# Patient Record
Sex: Male | Born: 1985 | Race: White | Hispanic: No | Marital: Single | Smoking: Current every day smoker
Health system: Southern US, Community
[De-identification: ages and names within clinical notes are randomized; demographics above are authoritative.]

## PROBLEM LIST (undated history)

## (undated) DIAGNOSIS — R569 Unspecified convulsions: Secondary | ICD-10-CM

## (undated) HISTORY — PX: TYMPANOSTOMY TUBE PLACEMENT: SHX32

---

## 2013-12-21 ENCOUNTER — Encounter (HOSPITAL_COMMUNITY): Payer: Self-pay | Admitting: Emergency Medicine

## 2013-12-21 ENCOUNTER — Emergency Department (HOSPITAL_COMMUNITY): Payer: BC Managed Care – PPO

## 2013-12-21 ENCOUNTER — Emergency Department (HOSPITAL_COMMUNITY)
Admission: EM | Admit: 2013-12-21 | Discharge: 2013-12-21 | Disposition: A | Discharge status: Home / Self Care | Payer: BC Managed Care – PPO | Attending: Emergency Medicine | Admitting: Emergency Medicine

## 2013-12-21 DIAGNOSIS — S8263XA Displaced fracture of lateral malleolus of unspecified fibula, initial encounter for closed fracture: Secondary | ICD-10-CM | POA: Diagnosis not present

## 2013-12-21 DIAGNOSIS — Y9289 Other specified places as the place of occurrence of the external cause: Secondary | ICD-10-CM | POA: Diagnosis not present

## 2013-12-21 DIAGNOSIS — S99919A Unspecified injury of unspecified ankle, initial encounter: Secondary | ICD-10-CM

## 2013-12-21 DIAGNOSIS — Y990 Civilian activity done for income or pay: Secondary | ICD-10-CM | POA: Diagnosis not present

## 2013-12-21 DIAGNOSIS — F17200 Nicotine dependence, unspecified, uncomplicated: Secondary | ICD-10-CM | POA: Insufficient documentation

## 2013-12-21 DIAGNOSIS — S8990XA Unspecified injury of unspecified lower leg, initial encounter: Secondary | ICD-10-CM | POA: Diagnosis present

## 2013-12-21 DIAGNOSIS — S82891A Other fracture of right lower leg, initial encounter for closed fracture: Secondary | ICD-10-CM

## 2013-12-21 DIAGNOSIS — Z79899 Other long term (current) drug therapy: Secondary | ICD-10-CM | POA: Insufficient documentation

## 2013-12-21 DIAGNOSIS — S99929A Unspecified injury of unspecified foot, initial encounter: Secondary | ICD-10-CM

## 2013-12-21 DIAGNOSIS — X500XXA Overexertion from strenuous movement or load, initial encounter: Secondary | ICD-10-CM | POA: Diagnosis not present

## 2013-12-21 MED ORDER — IBUPROFEN 200 MG PO TABS
600.0000 mg | ORAL_TABLET | Freq: Once | ORAL | Status: AC
  Administered 2013-12-21: 600 mg via ORAL
  Filled 2013-12-21: qty 3

## 2013-12-21 MED ORDER — HYDROCODONE-ACETAMINOPHEN 5-325 MG PO TABS: 1.0000 | ORAL_TABLET | Freq: Four times a day (QID) | ORAL | Status: DC | PRN

## 2013-12-21 MED ORDER — IBUPROFEN 600 MG PO TABS: 600.0000 mg | ORAL_TABLET | Freq: Once | ORAL | Status: DC

## 2013-12-21 NOTE — ED Provider Notes (Signed)
CSN: [id number]     Arrival date & time 12/21/13  2100 History   None   This chart was scribed for non-physician practitioner [name], PA-C working with [name], MD, by Andrew Au, ED Scribe. This patient was seen in room WTR8/WTR8 and the patient's care was started at 9:46 PM. Chief Complaint  Patient presents with  . Ankle Pain   The history is provided by the patient. No language interpreter was used.   Kevin Roth is a 28 y.o. male who presents to the Emergency Department complaining of right ankle pain onset 8 hours ago. Pt states he twisted his ankle inward when stepping off a curb. Pt reports he's been on his feet all day while at work and did not start to feel the pain until he took his shoe off. Pt reports worsening throbbing pain and pain with bearing weight. He denies taking pain medication. Pt reports h/o broken left ankle.   History reviewed. No pertinent past medical history. Past Surgical History  Procedure Laterality Date  . Tympanostomy tube placement     No family history on file. History  Substance Use Topics  . Smoking status: Current Every Day Smoker -- 1.00 packs/day for 10 years    Types: Cigarettes  . Smokeless tobacco: Never Used  . Alcohol Use: Yes     Comment: Socially    Review of Systems  Musculoskeletal: Positive for arthralgias, gait problem and myalgias.  Skin: Negative for wound.   Allergies  Review of patient's allergies indicates no known allergies.  Home Medications   Prior to Admission medications   Medication Sig Start Date End Date Taking? Authorizing Provider  aspirin 325 MG tablet Take 325 mg by mouth every 4 (four) hours as needed for headache.   Yes Historical Provider, MD  omeprazole (PRILOSEC) 20 MG capsule Take 20 mg by mouth daily.   Yes Historical Provider, MD  HYDROcodone-acetaminophen (NORCO/VICODIN) 5-325 MG per tablet Take 1 tablet by mouth every 6 (six) hours as needed for severe pain. 12/21/13   Arman Filter, NP   ibuprofen (ADVIL,MOTRIN) 600 MG tablet Take 1 tablet (600 mg total) by mouth once. 12/21/13   Arman Filter, NP   BP 136/86  Pulse 72  Temp(Src) 98 F (36.7 C) (Oral)  Resp 18  SpO2 99% Physical Exam  Nursing note and vitals reviewed. Constitutional: He is oriented to person, place, and time. He appears well-developed and well-nourished. No distress.  HENT:  Head: Normocephalic and atraumatic.  Eyes: Conjunctivae and EOM are normal.  Neck: Neck supple.  Cardiovascular: Normal rate.   Pulmonary/Chest: Effort normal.  Musculoskeletal: Normal range of motion. He exhibits tenderness.       Right ankle: He exhibits swelling and ecchymosis. He exhibits normal range of motion, no deformity, no laceration and normal pulse. Tenderness. Lateral malleolus tenderness found. No posterior TFL and no head of 5th metatarsal tenderness found. Achilles tendon normal.  Neurological: He is alert and oriented to person, place, and time.  Skin: Skin is warm and dry.  Psychiatric: He has a normal mood and affect. His behavior is normal.    ED Course  Procedures (including critical care time) DIAGNOSTIC STUDIES: Oxygen Saturation is 99% on RA, normal by my interpretation.    COORDINATION OF CARE: 7:47 PM- Pt advised of plan for treatment and pt agrees.  Labs Review Labs Reviewed - No data to display  Imaging Review Dg Ankle Complete Right  12/21/2013   CLINICAL DATA:  Twisting injury today.  EXAM: RIGHT ANKLE - COMPLETE 3+ VIEW  COMPARISON:  None.  FINDINGS: Tiny irregular bony fragment along the lateral calcaneal soft tissues seen only on the frontal radiograph. Os perineum. Ankle mortise appears congruent and tibiofibular syndesmotic intact. No destructive bony lesions. Mild soft tissue swelling without subcutaneous gas or radiopaque foreign bodies.  IMPRESSION: Bony fragment in lateral calcaneal soft tissues concerning for acute avulsion injury, no donor site. No dislocation.   Electronically Signed    By: Awilda Metro   On: 12/21/2013 22:46     EKG Interpretation None      MDM   Final diagnoses:  Avulsion fracture of ankle, right, closed, initial encounter    I personally performed the services described in this documentation, which was scribed in my presence. The recorded information has been reviewed and is accurate.      Arman Filter, NP 12/22/13 1947

## 2013-12-21 NOTE — ED Notes (Signed)
Pt reports having right ankle pain while stepping off a curb. Pt reports the foot turning inward while stepping down. Pt reports pain to the top of the foot with mild swelling and pain to the right lateral ankle. Pt is A/O x4, in NAD, and vitals are WDL.

## 2013-12-21 NOTE — Discharge Instructions (Signed)
As shown on your x ray you have a very tiny avulsion fracture  Your have been placed in a CAM Walker for support to allow the area to heal  For the next 7-10 days please wear this at all times while up and walking  Please make an appointment with Dr. Charlann Boxer in the next 2 weeks so he can monitor healing

## 2013-12-22 NOTE — ED Provider Notes (Signed)
Medical screening examination/treatment/procedure(s) were performed by non-physician practitioner and as supervising physician I was immediately available for consultation/collaboration.   EKG Interpretation None       [name], MD 12/22/13 2320

## 2015-09-03 DIAGNOSIS — H6123 Impacted cerumen, bilateral: Secondary | ICD-10-CM | POA: Diagnosis not present

## 2015-09-03 DIAGNOSIS — R197 Diarrhea, unspecified: Secondary | ICD-10-CM | POA: Diagnosis not present

## 2015-11-16 DIAGNOSIS — E785 Hyperlipidemia, unspecified: Secondary | ICD-10-CM | POA: Diagnosis not present

## 2015-11-16 DIAGNOSIS — L989 Disorder of the skin and subcutaneous tissue, unspecified: Secondary | ICD-10-CM | POA: Diagnosis not present

## 2015-11-16 DIAGNOSIS — S30860A Insect bite (nonvenomous) of lower back and pelvis, initial encounter: Secondary | ICD-10-CM | POA: Diagnosis not present

## 2015-11-16 DIAGNOSIS — R030 Elevated blood-pressure reading, without diagnosis of hypertension: Secondary | ICD-10-CM | POA: Diagnosis not present

## 2016-01-14 DIAGNOSIS — J069 Acute upper respiratory infection, unspecified: Secondary | ICD-10-CM | POA: Diagnosis not present

## 2016-05-17 DIAGNOSIS — E7800 Pure hypercholesterolemia, unspecified: Secondary | ICD-10-CM | POA: Diagnosis not present

## 2016-05-17 DIAGNOSIS — Z8249 Family history of ischemic heart disease and other diseases of the circulatory system: Secondary | ICD-10-CM | POA: Diagnosis not present

## 2017-05-15 DIAGNOSIS — E7800 Pure hypercholesterolemia, unspecified: Secondary | ICD-10-CM | POA: Diagnosis not present

## 2017-12-22 DIAGNOSIS — E7800 Pure hypercholesterolemia, unspecified: Secondary | ICD-10-CM | POA: Diagnosis not present

## 2017-12-22 DIAGNOSIS — F439 Reaction to severe stress, unspecified: Secondary | ICD-10-CM | POA: Diagnosis not present

## 2017-12-22 DIAGNOSIS — Z0000 Encounter for general adult medical examination without abnormal findings: Secondary | ICD-10-CM | POA: Diagnosis not present

## 2017-12-22 DIAGNOSIS — M545 Low back pain: Secondary | ICD-10-CM | POA: Diagnosis not present

## 2017-12-22 DIAGNOSIS — Z23 Encounter for immunization: Secondary | ICD-10-CM | POA: Diagnosis not present

## 2018-02-21 DIAGNOSIS — J029 Acute pharyngitis, unspecified: Secondary | ICD-10-CM | POA: Diagnosis not present

## 2018-02-27 DIAGNOSIS — F17210 Nicotine dependence, cigarettes, uncomplicated: Secondary | ICD-10-CM | POA: Diagnosis not present

## 2018-02-27 DIAGNOSIS — J209 Acute bronchitis, unspecified: Secondary | ICD-10-CM | POA: Diagnosis not present

## 2018-06-22 DIAGNOSIS — E7800 Pure hypercholesterolemia, unspecified: Secondary | ICD-10-CM | POA: Diagnosis not present

## 2018-06-22 DIAGNOSIS — F17210 Nicotine dependence, cigarettes, uncomplicated: Secondary | ICD-10-CM | POA: Diagnosis not present

## 2018-10-12 DIAGNOSIS — R109 Unspecified abdominal pain: Secondary | ICD-10-CM | POA: Diagnosis not present

## 2018-10-12 DIAGNOSIS — R195 Other fecal abnormalities: Secondary | ICD-10-CM | POA: Diagnosis not present

## 2018-10-12 DIAGNOSIS — K30 Functional dyspepsia: Secondary | ICD-10-CM | POA: Diagnosis not present

## 2018-11-05 DIAGNOSIS — R109 Unspecified abdominal pain: Secondary | ICD-10-CM | POA: Diagnosis not present

## 2018-11-05 DIAGNOSIS — R195 Other fecal abnormalities: Secondary | ICD-10-CM | POA: Diagnosis not present

## 2018-11-20 DIAGNOSIS — K30 Functional dyspepsia: Secondary | ICD-10-CM | POA: Diagnosis not present

## 2019-01-01 DIAGNOSIS — Z23 Encounter for immunization: Secondary | ICD-10-CM | POA: Diagnosis not present

## 2019-01-01 DIAGNOSIS — E7800 Pure hypercholesterolemia, unspecified: Secondary | ICD-10-CM | POA: Diagnosis not present

## 2019-01-01 DIAGNOSIS — Z0000 Encounter for general adult medical examination without abnormal findings: Secondary | ICD-10-CM | POA: Diagnosis not present

## 2019-06-12 DIAGNOSIS — L03811 Cellulitis of head [any part, except face]: Secondary | ICD-10-CM | POA: Diagnosis not present

## 2019-06-14 DIAGNOSIS — L03811 Cellulitis of head [any part, except face]: Secondary | ICD-10-CM | POA: Diagnosis not present

## 2019-07-05 ENCOUNTER — Ambulatory Visit: Payer: Self-pay | Attending: Internal Medicine

## 2019-07-05 DIAGNOSIS — Z23 Encounter for immunization: Secondary | ICD-10-CM

## 2019-07-05 NOTE — Progress Notes (Signed)
   Covid-19 Vaccination Clinic  Name:  Kevin Roth    MRN: [id number] DOB: 1986-02-04  07/05/2019  Kevin Roth was observed post Covid-19 immunization for 15 minutes without incident. He was provided with Vaccine Information Sheet and instruction to access the V-Safe system.   Kevin Roth was instructed to call 911 with any severe reactions post vaccine: Marland Kitchen Difficulty breathing  . Swelling of face and throat  . A fast heartbeat  . A bad rash all over body  . Dizziness and weakness   Immunizations Administered    Name Date Dose VIS Date Route   Pfizer COVID-19 Vaccine 07/05/2019  9:26 AM 0.3 mL 03/29/2019 Intramuscular   Manufacturer: [employer], [employer]   Lot: [id number]   NDC: [address]-5

## 2019-07-31 ENCOUNTER — Ambulatory Visit: Payer: Self-pay | Attending: Internal Medicine

## 2019-07-31 DIAGNOSIS — Z23 Encounter for immunization: Secondary | ICD-10-CM

## 2019-07-31 NOTE — Progress Notes (Signed)
   Covid-19 Vaccination Clinic  Name:  Eyad S [place]    MRN: [id number] DOB: [birth year]  07/31/2019  Mr. [place] was observed post Covid-19 immunization for 15 minutes without incident. He was provided with Vaccine Information Sheet and instruction to access the V-Safe system.   Mr. [place] was instructed to call 911 with any severe reactions post vaccine: Marland Kitchen Difficulty breathing  . Swelling of face and throat  . A fast heartbeat  . A bad rash all over body  . Dizziness and weakness   Immunizations Administered    Name Date Dose VIS Date Route   Pfizer COVID-19 Vaccine 07/31/2019  8:15 AM 0.3 mL 03/29/2019 Intramuscular   Manufacturer: [employer], [employer]   Lot: [id number]   NDC: [address]-9

## 2020-01-02 DIAGNOSIS — E7800 Pure hypercholesterolemia, unspecified: Secondary | ICD-10-CM | POA: Diagnosis not present

## 2020-01-02 DIAGNOSIS — Z0000 Encounter for general adult medical examination without abnormal findings: Secondary | ICD-10-CM | POA: Diagnosis not present

## 2020-01-02 DIAGNOSIS — Z23 Encounter for immunization: Secondary | ICD-10-CM | POA: Diagnosis not present

## 2020-07-15 DIAGNOSIS — H0288B Meibomian gland dysfunction left eye, upper and lower eyelids: Secondary | ICD-10-CM | POA: Diagnosis not present

## 2020-07-15 DIAGNOSIS — H52223 Regular astigmatism, bilateral: Secondary | ICD-10-CM | POA: Diagnosis not present

## 2020-07-15 DIAGNOSIS — H0288A Meibomian gland dysfunction right eye, upper and lower eyelids: Secondary | ICD-10-CM | POA: Diagnosis not present

## 2020-07-15 DIAGNOSIS — H5213 Myopia, bilateral: Secondary | ICD-10-CM | POA: Diagnosis not present

## 2020-07-15 DIAGNOSIS — H0102B Squamous blepharitis left eye, upper and lower eyelids: Secondary | ICD-10-CM | POA: Diagnosis not present

## 2020-07-15 DIAGNOSIS — Q141 Congenital malformation of retina: Secondary | ICD-10-CM | POA: Diagnosis not present

## 2020-08-17 ENCOUNTER — Emergency Department (HOSPITAL_COMMUNITY): Payer: BC Managed Care – PPO

## 2020-08-17 ENCOUNTER — Other Ambulatory Visit: Payer: Self-pay

## 2020-08-17 ENCOUNTER — Emergency Department (HOSPITAL_COMMUNITY)
Admission: EM | Admit: 2020-08-17 | Discharge: 2020-08-18 | Disposition: A | Discharge status: Home / Self Care | Payer: BC Managed Care – PPO | Attending: Emergency Medicine | Admitting: Emergency Medicine

## 2020-08-17 DIAGNOSIS — F17210 Nicotine dependence, cigarettes, uncomplicated: Secondary | ICD-10-CM | POA: Insufficient documentation

## 2020-08-17 DIAGNOSIS — S24109A Unspecified injury at unspecified level of thoracic spinal cord, initial encounter: Secondary | ICD-10-CM | POA: Diagnosis not present

## 2020-08-17 DIAGNOSIS — Z20822 Contact with and (suspected) exposure to covid-19: Secondary | ICD-10-CM | POA: Diagnosis not present

## 2020-08-17 DIAGNOSIS — M545 Low back pain, unspecified: Secondary | ICD-10-CM | POA: Diagnosis not present

## 2020-08-17 DIAGNOSIS — Y92410 Unspecified street and highway as the place of occurrence of the external cause: Secondary | ICD-10-CM | POA: Diagnosis not present

## 2020-08-17 DIAGNOSIS — Z7982 Long term (current) use of aspirin: Secondary | ICD-10-CM | POA: Insufficient documentation

## 2020-08-17 DIAGNOSIS — S3992XA Unspecified injury of lower back, initial encounter: Secondary | ICD-10-CM | POA: Diagnosis not present

## 2020-08-17 DIAGNOSIS — Q7649 Other congenital malformations of spine, not associated with scoliosis: Secondary | ICD-10-CM | POA: Diagnosis not present

## 2020-08-17 DIAGNOSIS — M549 Dorsalgia, unspecified: Secondary | ICD-10-CM | POA: Diagnosis not present

## 2020-08-17 DIAGNOSIS — S22080A Wedge compression fracture of T11-T12 vertebra, initial encounter for closed fracture: Secondary | ICD-10-CM | POA: Diagnosis not present

## 2020-08-17 DIAGNOSIS — Q766 Other congenital malformations of ribs: Secondary | ICD-10-CM | POA: Diagnosis not present

## 2020-08-17 MED ORDER — OXYCODONE-ACETAMINOPHEN 5-325 MG PO TABS: 1.0000 | ORAL_TABLET | Freq: Once | ORAL | Status: DC

## 2020-08-17 MED ORDER — HYDROMORPHONE HCL 1 MG/ML IJ SOLN: 1.0000 mg | Freq: Once | INTRAMUSCULAR | Status: DC

## 2020-08-17 MED ORDER — DIAZEPAM 5 MG/ML IJ SOLN
5.0000 mg | Freq: Once | INTRAMUSCULAR | Status: AC
  Administered 2020-08-17: 5 mg via INTRAVENOUS
  Filled 2020-08-17: qty 2

## 2020-08-17 MED ORDER — KETOROLAC TROMETHAMINE 30 MG/ML IJ SOLN
30.0000 mg | Freq: Once | INTRAMUSCULAR | Status: AC
  Administered 2020-08-17: 30 mg via INTRAVENOUS
  Filled 2020-08-17: qty 1

## 2020-08-17 MED ORDER — HYDROMORPHONE HCL 1 MG/ML IJ SOLN
1.0000 mg | Freq: Once | INTRAMUSCULAR | Status: AC
  Administered 2020-08-17: 1 mg via INTRAVENOUS
  Filled 2020-08-17: qty 1

## 2020-08-17 MED ORDER — OXYCODONE-ACETAMINOPHEN 5-325 MG PO TABS: 1.0000 | ORAL_TABLET | Freq: Four times a day (QID) | ORAL | 0 refills | Status: DC | PRN

## 2020-08-17 MED ORDER — DIAZEPAM 5 MG PO TABS: 5.0000 mg | ORAL_TABLET | Freq: Four times a day (QID) | ORAL | 0 refills | Status: DC | PRN
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
## 2020-08-17 NOTE — Discharge Instructions (Signed)
Take Percocet for pain.  Take Valium for muscle spasms  Wear your brace at all times.  Follow-up with Dr. Jake Samples  Return to ER if you have worse back pain, numbness, weakness.
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
## 2020-08-18 LAB — RESP PANEL BY RT-PCR (FLU A&B, COVID) ARPGX2
Influenza A by PCR: NEGATIVE
Influenza B by PCR: NEGATIVE
SARS Coronavirus 2 by RT PCR: NEGATIVE

## 2020-08-18 LAB — CBC WITH DIFFERENTIAL/PLATELET
Abs Immature Granulocytes: 0.08 10*3/uL — ABNORMAL HIGH (ref 0.00–0.07)
Basophils Absolute: 0 10*3/uL (ref 0.0–0.1)
Basophils Relative: 0 %
Eosinophils Absolute: 0 10*3/uL (ref 0.0–0.5)
Eosinophils Relative: 0 %
HCT: 42.6 % (ref 39.0–52.0)
Hemoglobin: 14.5 g/dL (ref 13.0–17.0)
Immature Granulocytes: 1 %
Lymphocytes Relative: 6 %
Lymphs Abs: 0.9 10*3/uL (ref 0.7–4.0)
MCH: 32.6 pg (ref 26.0–34.0)
MCHC: 34 g/dL (ref 30.0–36.0)
MCV: 95.7 fL (ref 80.0–100.0)
Monocytes Absolute: 0.8 10*3/uL (ref 0.1–1.0)
Monocytes Relative: 5 %
Neutro Abs: 15.3 10*3/uL — ABNORMAL HIGH (ref 1.7–7.7)
Neutrophils Relative %: 88 %
Platelets: 191 10*3/uL (ref 150–400)
RBC: 4.45 MIL/uL (ref 4.22–5.81)
RDW: 12.7 % (ref 11.5–15.5)
WBC: 17.1 10*3/uL — ABNORMAL HIGH (ref 4.0–10.5)
nRBC: 0 % (ref 0.0–0.2)

## 2020-08-18 LAB — BASIC METABOLIC PANEL
Anion gap: 10 (ref 5–15)
BUN: 13 mg/dL (ref 6–20)
CO2: 22 mmol/L (ref 22–32)
Calcium: 9 mg/dL (ref 8.9–10.3)
Chloride: 108 mmol/L (ref 98–111)
Creatinine, Ser: 0.93 mg/dL (ref 0.61–1.24)
GFR, Estimated: >60 mL/min (ref >60)
Glucose, Bld: 102 mg/dL — ABNORMAL HIGH (ref 70–99)
Potassium: 3.9 mmol/L (ref 3.5–5.1)
Sodium: 140 mmol/L (ref 135–145)

## 2020-08-18 NOTE — ED Provider Notes (Signed)
TLSO brace in place. Patient able to ambulate. His pain is controlled. No weakness, numbness, tingling.  Followup with neurosurgery. Return precautions discussed.    Glynn Octave, MD 08/18/20 [phone]

## 2020-08-18 NOTE — Progress Notes (Signed)
Orthopedic Tech Progress Note Patient Details:  Kevin Roth Feb 09, 1986 [id number] Pt did not want the TLSO brace applied at the time of my arrival. The brace was adjusted to properly fit the pt whenever he is ready to apply it. The pt was instructed on how to properly adjust, apply and remove brace. He stated that he would apply the brace himself when his pain subsides.  Ortho Devices Type of Ortho Device: Lumbar corsett Ortho Device/Splint Location: Thoraic Lumbar Spine Ortho Device/Splint Interventions: Ordered,Adjustment   Post Interventions Instructions Provided: Adjustment of device,Care of device,Poper ambulation with device   Kevin Roth Kevin Roth 08/18/2020, 12:06 AM

## 2020-08-18 NOTE — ED Notes (Signed)
Pt ambulated around the room. Pt is moving cautiously but is able to walk on his own.

## 2020-08-18 NOTE — Progress Notes (Signed)
Orthopedic Tech Progress Note Patient Details:  Kevin Roth 09/02/1985 [id number] Applied TLSO Brace to pt Patient ID: Kevin Roth, male   DOB: [birth year], 35 y.o.   MRN: [id number]   Gerald Stabs 08/18/2020, 1:27 AM

## 2020-08-19 DIAGNOSIS — R030 Elevated blood-pressure reading, without diagnosis of hypertension: Secondary | ICD-10-CM | POA: Diagnosis not present

## 2020-08-19 DIAGNOSIS — S32010A Wedge compression fracture of first lumbar vertebra, initial encounter for closed fracture: Secondary | ICD-10-CM | POA: Diagnosis not present

## 2020-09-23 DIAGNOSIS — S32010A Wedge compression fracture of first lumbar vertebra, initial encounter for closed fracture: Secondary | ICD-10-CM | POA: Diagnosis not present

## 2020-10-28 DIAGNOSIS — S32010A Wedge compression fracture of first lumbar vertebra, initial encounter for closed fracture: Secondary | ICD-10-CM | POA: Diagnosis not present

## 2020-10-28 DIAGNOSIS — R030 Elevated blood-pressure reading, without diagnosis of hypertension: Secondary | ICD-10-CM | POA: Diagnosis not present

## 2021-01-05 DIAGNOSIS — F17210 Nicotine dependence, cigarettes, uncomplicated: Secondary | ICD-10-CM | POA: Diagnosis not present

## 2021-01-05 DIAGNOSIS — J029 Acute pharyngitis, unspecified: Secondary | ICD-10-CM | POA: Diagnosis not present

## 2021-01-11 DIAGNOSIS — Z0000 Encounter for general adult medical examination without abnormal findings: Secondary | ICD-10-CM | POA: Diagnosis not present

## 2021-01-11 DIAGNOSIS — E7800 Pure hypercholesterolemia, unspecified: Secondary | ICD-10-CM | POA: Diagnosis not present

## 2021-04-06 ENCOUNTER — Encounter (HOSPITAL_COMMUNITY): Payer: Self-pay

## 2021-04-06 ENCOUNTER — Emergency Department (HOSPITAL_COMMUNITY)
Admission: EM | Admit: 2021-04-06 | Discharge: 2021-04-07 | Disposition: A | Discharge status: Home / Self Care | Payer: BC Managed Care – PPO | Attending: Emergency Medicine | Admitting: Emergency Medicine

## 2021-04-06 ENCOUNTER — Emergency Department (HOSPITAL_COMMUNITY): Payer: BC Managed Care – PPO

## 2021-04-06 DIAGNOSIS — Z79899 Other long term (current) drug therapy: Secondary | ICD-10-CM | POA: Diagnosis not present

## 2021-04-06 DIAGNOSIS — F17290 Nicotine dependence, other tobacco product, uncomplicated: Secondary | ICD-10-CM | POA: Insufficient documentation

## 2021-04-06 DIAGNOSIS — X58XXXA Exposure to other specified factors, initial encounter: Secondary | ICD-10-CM | POA: Diagnosis not present

## 2021-04-06 DIAGNOSIS — G40909 Epilepsy, unspecified, not intractable, without status epilepticus: Secondary | ICD-10-CM | POA: Insufficient documentation

## 2021-04-06 DIAGNOSIS — R404 Transient alteration of awareness: Secondary | ICD-10-CM | POA: Diagnosis not present

## 2021-04-06 DIAGNOSIS — R58 Hemorrhage, not elsewhere classified: Secondary | ICD-10-CM | POA: Diagnosis not present

## 2021-04-06 DIAGNOSIS — S01512A Laceration without foreign body of oral cavity, initial encounter: Secondary | ICD-10-CM | POA: Diagnosis not present

## 2021-04-06 DIAGNOSIS — S0993XA Unspecified injury of face, initial encounter: Secondary | ICD-10-CM | POA: Diagnosis not present

## 2021-04-06 DIAGNOSIS — R569 Unspecified convulsions: Secondary | ICD-10-CM | POA: Diagnosis not present

## 2021-04-06 DIAGNOSIS — R9431 Abnormal electrocardiogram [ECG] [EKG]: Secondary | ICD-10-CM | POA: Diagnosis not present

## 2021-04-06 HISTORY — DX: Unspecified convulsions: R56.9

## 2021-04-06 LAB — CBC WITH DIFFERENTIAL/PLATELET
Abs Immature Granulocytes: 0.08 10*3/uL — ABNORMAL HIGH (ref 0.00–0.07)
Basophils Absolute: 0.1 10*3/uL (ref 0.0–0.1)
Basophils Relative: 1 %
Eosinophils Absolute: 0.3 10*3/uL (ref 0.0–0.5)
Eosinophils Relative: 2 %
HCT: 42.9 % (ref 39.0–52.0)
Hemoglobin: 14.9 g/dL (ref 13.0–17.0)
Immature Granulocytes: 1 %
Lymphocytes Relative: 12 %
Lymphs Abs: 1.7 10*3/uL (ref 0.7–4.0)
MCH: 33.1 pg (ref 26.0–34.0)
MCHC: 34.7 g/dL (ref 30.0–36.0)
MCV: 95.3 fL (ref 80.0–100.0)
Monocytes Absolute: 1.1 10*3/uL — ABNORMAL HIGH (ref 0.1–1.0)
Monocytes Relative: 8 %
Neutro Abs: 10.7 10*3/uL — ABNORMAL HIGH (ref 1.7–7.7)
Neutrophils Relative %: 76 %
Platelets: 210 10*3/uL (ref 150–400)
RBC: 4.5 MIL/uL (ref 4.22–5.81)
RDW: 12 % (ref 11.5–15.5)
WBC: 14 10*3/uL — ABNORMAL HIGH (ref 4.0–10.5)
nRBC: 0 % (ref 0.0–0.2)

## 2021-04-06 LAB — CBG MONITORING, ED: Glucose-Capillary: 77 mg/dL (ref 70–99)

## 2021-04-06 LAB — URINALYSIS, ROUTINE W REFLEX MICROSCOPIC
Bilirubin Urine: NEGATIVE
Glucose, UA: NEGATIVE mg/dL
Ketones, ur: NEGATIVE mg/dL
Leukocytes,Ua: NEGATIVE
Nitrite: NEGATIVE
Protein, ur: NEGATIVE mg/dL
Specific Gravity, Urine: 1.025 (ref 1.005–1.030)
pH: 5 (ref 5.0–8.0)

## 2021-04-06 LAB — COMPREHENSIVE METABOLIC PANEL
ALT: 15 U/L (ref 0–44)
AST: 24 U/L (ref 15–41)
Albumin: 3.9 g/dL (ref 3.5–5.0)
Alkaline Phosphatase: 47 U/L (ref 38–126)
Anion gap: 7 (ref 5–15)
BUN: 13 mg/dL (ref 6–20)
CO2: 25 mmol/L (ref 22–32)
Calcium: 8.9 mg/dL (ref 8.9–10.3)
Chloride: 108 mmol/L (ref 98–111)
Creatinine, Ser: 1.13 mg/dL (ref 0.61–1.24)
GFR, Estimated: >60 mL/min (ref >60)
Glucose, Bld: 92 mg/dL (ref 70–99)
Potassium: 4.1 mmol/L (ref 3.5–5.1)
Sodium: 140 mmol/L (ref 135–145)
Total Bilirubin: 0.4 mg/dL (ref 0.3–1.2)
Total Protein: 6.5 g/dL (ref 6.5–8.1)

## 2021-04-06 LAB — RAPID URINE DRUG SCREEN, HOSP PERFORMED
Amphetamines: NOT DETECTED
Barbiturates: NOT DETECTED
Benzodiazepines: NOT DETECTED
Cocaine: NOT DETECTED
Opiates: NOT DETECTED
Tetrahydrocannabinol: POSITIVE — AB

## 2021-04-06 LAB — MAGNESIUM: Magnesium: 2.1 mg/dL (ref 1.7–2.4)

## 2021-04-06 MED ORDER — MIDAZOLAM 5 MG/ML ADULT INJ FOR INTRANASAL USE (MC USE ONLY): 5.0000 mg | Freq: Once | INTRAMUSCULAR | Status: DC

## 2021-04-06 MED ORDER — LEVETIRACETAM 500 MG PO TABS: 500.0000 mg | ORAL_TABLET | Freq: Two times a day (BID) | ORAL | 0 refills | Status: DC

## 2021-04-06 MED ORDER — LEVETIRACETAM IN NACL 1000 MG/100ML IV SOLN
1000.0000 mg | Freq: Once | INTRAVENOUS | Status: AC
  Administered 2021-04-06: 21:00:00 1000 mg via INTRAVENOUS
  Filled 2021-04-06: qty 100

## 2021-04-06 MED ORDER — MIDAZOLAM 5 MG/ML ADULT INJ FOR INTRANASAL USE (MC USE ONLY)
5.0000 mg | Freq: Once | INTRAMUSCULAR | 0 refills | Status: DC
Start: 2021-04-06 — End: 2021-04-07

## 2021-04-06 MED ORDER — LIDOCAINE VISCOUS HCL 2 % MT SOLN
15.0000 mL | Freq: Once | OROMUCOSAL | Status: AC
  Administered 2021-04-06: 21:00:00 15 mL via OROMUCOSAL
  Filled 2021-04-06: qty 15

## 2021-04-06 NOTE — ED Provider Notes (Signed)
[facility] EMERGENCY DEPARTMENT Provider Note   CSN: [id number] Arrival date & time: 04/06/21  1829     History Chief Complaint  Patient presents with   Seizures    Kevin Roth is a 35 y.o. male.  35 y.o male with a PMH of Seizures ( as a child, on no current medication presents to the ED  via EMS status post seizure.  According to family members, patient went limp and began having full body seizures for approximately 5 minutes.  He did bit his tongue and there was a lot of blood and blood in his mouth.  On arrival, patient is unsure why he is here, states that he was out shopping when he last remembers and had bit his tongue earlier today.  Is endorsing pain along his tongue.  States that he did not pass out.  He does have a prior history of seizures when he was a child however is currently on no medication.  There has not been any medication changes, no additional stressors.  Denies any headache, chest pain, shortness of breath, or fevers.       The history is provided by the patient.  Seizures Seizure activity on arrival: no   Seizure type:  Grand mal Preceding symptoms: no dizziness and no hyperventilation   Initial focality:  Unable to specify     Past Medical History:  Diagnosis Date   Seizures (HCC)     There are no problems to display for this patient.   Past Surgical History:  Procedure Laterality Date   TYMPANOSTOMY TUBE PLACEMENT         History reviewed. No pertinent family history.  Social History   Tobacco Use   Smoking status: Every Day    Types: Cigars   Smokeless tobacco: Never  Vaping Use   Vaping Use: Never used  Substance Use Topics   Alcohol use: Yes    Comment: Socially   Drug use: No    Home Medications Prior to Admission medications   Medication Sig Start Date End Date Taking? Authorizing Provider  levETIRAcetam (KEPPRA) 500 MG tablet Take 1 tablet (500 mg total) by mouth 2 (two) times daily. 04/06/21  05/06/21 Yes Debborah Alonge, Leonie Douglas, PA-C  midazolam (VERSED) 5 MG/ML injection Place 1 mL (5 mg total) into the nose once for 1 dose. Draw up prescribed dose (ml) in syringe, remove blue vial access device, then attach syringe to nasal atomizer for intranasal administration. 04/06/21 04/06/21 Yes Jacalyn Lefevre, MD  aspirin 325 MG tablet Take 325 mg by mouth every 4 (four) hours as needed for headache.    [provider]  diazepam (VALIUM) 5 MG tablet Take 1 tablet (5 mg total) by mouth every 6 (six) hours as needed for anxiety (spasms). 08/17/20   Charlynne Pander, MD  HYDROcodone-acetaminophen (NORCO/VICODIN) 5-325 MG per tablet Take 1 tablet by mouth every 6 (six) hours as needed for severe pain. 12/21/13   [name], NP  ibuprofen (ADVIL,MOTRIN) 600 MG tablet Take 1 tablet (600 mg total) by mouth once. 12/21/13   [name], NP  omeprazole (PRILOSEC) 20 MG capsule Take 20 mg by mouth daily.    [provider]  oxyCODONE-acetaminophen (PERCOCET) 5-325 MG tablet Take 1 tablet by mouth every 6 (six) hours as needed. 08/17/20   Charlynne Pander, MD    Allergies    Patient has no known allergies.  Review of Systems   Review of Systems  Constitutional:  Negative for  chills and fever.  HENT:  Negative for sore throat.   Respiratory:  Negative for shortness of breath.   Cardiovascular:  Negative for chest pain.  Gastrointestinal:  Negative for abdominal pain, nausea and vomiting.  Skin:  Positive for wound.  Neurological:  Positive for seizures.  All other systems reviewed and are negative.  Physical Exam Updated Vital Signs BP 128/81    Pulse 90    Temp 98 F (36.7 C) (Oral)    Resp (!) 21    Ht 6\' 1"  (1.854 m)    Wt 89.4 kg    SpO2 97%    BMI 25.99 kg/m   Physical Exam Vitals and nursing note reviewed.  Constitutional:      Appearance: He is well-developed.  HENT:     Head: Normocephalic.     Comments: Large laceration to posterior right side.  Eyes:     General: No  scleral icterus.    Pupils: Pupils are equal, round, and reactive to light.  Cardiovascular:     Heart sounds: Normal heart sounds.  Pulmonary:     Effort: Pulmonary effort is normal.     Breath sounds: Normal breath sounds. No wheezing.  Chest:     Chest wall: No tenderness.  Abdominal:     General: Bowel sounds are normal. There is no distension.     Palpations: Abdomen is soft.     Tenderness: There is no abdominal tenderness.  Musculoskeletal:        General: No tenderness or deformity.     Cervical back: Normal range of motion.  Skin:    General: Skin is warm and dry.  Neurological:     Mental Status: He is alert and oriented to person, place, and time.     Comments: Alert, oriented, thought content appropriate. Speech fluent without evidence of aphasia. Able to follow 2 step commands without difficulty.  Cranial Nerves:  II:  Peripheral visual fields grossly normal, pupils, round, reactive to light III,IV, VI: ptosis not present, extra-ocular motions intact bilaterally  V,VII: smile symmetric, facial light touch sensation equal VIII: hearing grossly normal bilaterally  IX,X: midline uvula rise  XI: bilateral shoulder shrug equal and strong XII: midline tongue extension  Motor:  5/5 in upper and lower extremities bilaterally including strong and equal grip strength and dorsiflexion/plantar flexion Sensory: light touch normal in all extremities.  Cerebellar: normal finger-to-nose with bilateral upper extremities, pronator drift negative Gait: normal gait and balance      ED Results / Procedures / Treatments   Labs (all labs ordered are listed, but only abnormal results are displayed) Labs Reviewed  CBC WITH DIFFERENTIAL/PLATELET - Abnormal; Notable for the following components:      Result Value   WBC 14.0 (*)    Neutro Abs 10.7 (*)    Monocytes Absolute 1.1 (*)    Abs Immature Granulocytes 0.08 (*)    All other components within normal limits  URINALYSIS, ROUTINE  W REFLEX MICROSCOPIC - Abnormal; Notable for the following components:   APPearance CLOUDY (*)    Hgb urine dipstick SMALL (*)    Bacteria, UA MANY (*)    All other components within normal limits  RAPID URINE DRUG SCREEN, HOSP PERFORMED - Abnormal; Notable for the following components:   Tetrahydrocannabinol POSITIVE (*)    All other components within normal limits  COMPREHENSIVE METABOLIC PANEL  MAGNESIUM  CBG MONITORING, ED    EKG EKG Interpretation  Date/Time:  Tuesday April 06 2021 18:49:02 EST  Ventricular Rate:  92 PR Interval:  133 QRS Duration: 89 QT Interval:  345 QTC Calculation: 427 R Axis:   61 Text Interpretation: Sinus rhythm No old tracing to compare Confirmed by Jacalyn Lefevre [phone]) on 04/06/2021 7:22:22 PM  Radiology CT HEAD WO CONTRAST ( )  Result Date: 04/06/2021 CLINICAL DATA:  Seizure, new-onset, no history of trauma EXAM: CT HEAD WITHOUT CONTRAST TECHNIQUE: Contiguous axial images were obtained from the base of the skull through the vertex without intravenous contrast. COMPARISON:  CT head 05/24/2007 FINDINGS: Brain: No evidence of large-territorial acute infarction. No parenchymal hemorrhage. No mass lesion. No extra-axial collection. No mass effect or midline shift. No hydrocephalus. Basilar cisterns are patent. Vascular: No hyperdense vessel. Skull: No acute fracture or focal lesion. Sinuses/Orbits: Trace mucosal thickening. Otherwise paranasal sinuses and mastoid air cells are clear. The orbits are unremarkable. Other: None. IMPRESSION: No acute intracranial abnormality. Electronically Signed   By: Tish Frederickson M.D.   On: 04/06/2021 22:19    Procedures Procedures   Medications Ordered in ED Medications  midazolam (VERSED) 5 mg/ml ADULT INJ for INTRANASAL Use ([facility] Use ONLY) (has no administration in time range)  levETIRAcetam (KEPPRA) IVPB 1000 mg/100 mL premix (0 mg Intravenous Stopped 04/06/21 2123)  lidocaine (XYLOCAINE) 2 % viscous mouth  solution 15 mL (15 mLs Mouth/Throat Given 04/06/21 2053)    ED Course  I have reviewed the triage vital signs and the nursing notes.  Pertinent labs & imaging results that were available during my care of the patient were reviewed by me and considered in my medical decision making (see chart for details).  Clinical Course as of 04/06/21 2328  Tue Apr 06, 2021  2231 Bacteria, UA(!): MANY [JS]  2250 Tetrahydrocannabinol(!): POSITIVE [JS]    Clinical Course User Index [JS] Claude Manges, PA-C   MDM Rules/Calculators/A&P  Arrives to the ED via EMS status post witnessed seizure.  No prior history of seizure, did have 1 seizure when he was a child at the age of 37, previously followed by neurology placed on Dilantin for 2 years after his first seizure.  He reports he does not remember the incident, is stating pain along the right posterior aspect of his tongue where there is a large laceration noted with some bleeding.  His vitals were stable on arrival, he denies any recent medication changes, sickness, trauma.  Neurological exam is unremarkable, patient is texting on his phone during our interview.  His pupils are equal and reactive.  Has good strength to upper and lower extremities.  Lungs are clear to auscultation, abdomen is soft nontender to palpation.  No leg swelling noted.  Contact family in order to obtain collateral information.  6:52 PM Spoke to Father Stuart Roth.  7:47 PM spoke to patient's mother at the bedside to obtain collateral information.  She reports they were out shopping, when suddenly patient was complaining of pain to his right tongue, she suspects that he likely had a previous seizure aside from the one she witnessed.  When they arrived at the house, he states patient stated "I have not felt like this in 15 years ", then he began having a grand mal seizure, his eyes rolled to the back of his head, he was stiff prior to this occurring.  This lasted about 5 minutes until  EMS arrived, he had stopped seizing then.  He has not been on medication for any of this.  He used to be on Dilantin for 2 years after a seizure that  he had when he was 35 years old.  Labs on today's visit showed a CBC with a slight leukocytosis of 14, denies any infectious signs no fever, no urinary symptoms, no vomiting, no nausea, no URI symptoms.  Hemoglobin is within normal limits.  CMP is unremarkable without any electrolyte derangement, creatinine levels within normal limits.  Magnesium levels normal.  His glucose on arrival was within normal limits. As this is likely patient's second seizure, we we will provide him with a loading dose of 1000 mg of Keppra, will also obtain CT imaging as this is a second seizure within the past 24 hours.  CT head without any acute findings.  Will call neurology to obtain further recommendations at this time.  Patient has been in the emergency department for approximately 4 hours, did not have any seizure activity.  Is resting comfortably hemodynamically stable.  Spoke to neurology Dr. Derry Lory who agrees with outpatient treatment at this time.  Patient will need to be started on Keppra 500 mg twice daily for the next 30 days, he will also need to follow-up with neurology.  He will also go home on a 1 dose of midazolam intranasal for prolonged seizures.  I discussed this in epic another along with mother at the bedside.  We also discussed cessation of marijuana as this is likely lowering his seizure threshold. Provided with Versed for his disposition.   Patient understands and agrees to management at this time, return precautions discussed at length.  Patient stable for discharge.   Portions of this note were generated with Scientist, clinical (histocompatibility and immunogenetics). Dictation errors may occur despite best attempts at proofreading.  Final Clinical Impression(s) / ED Diagnoses Final diagnoses:  Seizure [facility])    Rx / DC Orders ED Discharge Orders          Ordered     levETIRAcetam (KEPPRA) 500 MG tablet  2 times daily        04/06/21 2325    midazolam (VERSED) 5 MG/ML injection   Once        04/06/21 2326             Claude Manges, PA-C 04/06/21 2328    Jacalyn Lefevre, MD 04/09/21 [phone]

## 2021-04-06 NOTE — Discharge Instructions (Addendum)
The CT of your head did not show any acute findings.  Your laboratory results are within normal limits.  You were given 2 medications on today's visit.  The first medication is Keppra, you will need to take 1 tablet twice a day for the next 30 days.  You will likely need to continue this medication after your visit with neurology.  The second medication is Versed, please apply this as needed, you will only need to use this if your seizure lasts longer than 5 minutes.  Please educate family members on usage of this medication.  You cannot drive for the next 6 months after your first seizure episode as of today.  You will need complete follow-up with neurology to further evaluate your seizures.

## 2021-04-06 NOTE — ED Triage Notes (Signed)
Pt bib EMS for seizure, pt was at home after shopping all day and began acting strange, family reports he went limp and began having full body seizures for about 5 min., he bit his tongue and had a bloody mouth, pt is now lethargic and feels "sleepY"

## 2021-04-07 ENCOUNTER — Encounter: Payer: Self-pay | Admitting: Neurology

## 2021-04-07 MED ORDER — MIDAZOLAM 5 MG/ML ADULT INJ FOR INTRANASAL USE (MC USE ONLY)
5.0000 mg | Freq: Once | INTRAMUSCULAR | 0 refills | Status: DC
Start: 2021-04-07 — End: 2021-04-14

## 2021-04-14 ENCOUNTER — Other Ambulatory Visit: Payer: Self-pay

## 2021-04-14 ENCOUNTER — Ambulatory Visit: Payer: BC Managed Care – PPO | Admitting: Neurology

## 2021-04-14 ENCOUNTER — Encounter: Payer: Self-pay | Admitting: Neurology

## 2021-04-14 VITALS — BP 125/54 | HR 63 | Ht 74.0 in | Wt 200.0 lb

## 2021-04-14 DIAGNOSIS — R569 Unspecified convulsions: Secondary | ICD-10-CM

## 2021-04-14 MED ORDER — LAMOTRIGINE 25 MG PO TABS: ORAL_TABLET | ORAL | 0 refills | Status: DC

## 2021-04-14 MED ORDER — LAMOTRIGINE 100 MG PO TABS: ORAL_TABLET | ORAL | 6 refills | Status: DC

## 2021-04-14 NOTE — Progress Notes (Signed)
NEUROLOGY CONSULTATION NOTE  Kevin Roth MRN: [id number] DOB: 1986-02-21  Referring provider: Claude Manges, PA-C Primary care provider: Dr. Tally Joe  Reason for consult:  seizures   Thank you for your kind referral of Kevin Roth for consultation of the above symptoms. Although his history is well known to you, please allow me to reiterate it for the purpose of our medical record. The patient was accompanied to the clinic by his mother who also provides collateral information. Records and images were personally reviewed where available.  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 Sharene Skeans with Dilantin for 2 years, off medication since age 77 and seizure-free for 29 years until 04/06/21. He brings a bottle of apple butter and states that his mother made him apple butter toast that morning then he immediately got tired and lay back in bed. He fell asleep and woke up with his tongue sore. His body was not sore, no incontinence. Later that afternoon, they went on errands for last minute Christmas shopping, his last memory was being at [employer]. He does not recall that they went to [facility] after, he woke up in the ambulance. His mother reports he was fine the whole time they were at [facility], they got in the car for home and he started saying he had not felt like this in 15 years, like he was "starting to float." He was in the passenger seat and his mother noticed his speech was slurred then his head turned to the right and he had a generalized convulsion. He bit the right side of his tongue with profuse bleeding. He felt normal when he woke up in the ambulance, unaware of events, no focal weakness, incontinence. He felt sore later on. He was brought to the ER where CBC showed a WBC of 14. CMP normal, UDS positive for THC. I personally reviewed head CT without contrast which was  unremarkable. He was discharged home on Levetiracetam 500mg  BID.   They deny any further seizures since 04/06/21. He denies any episodes of staring/unresponsiveness, gaps in time, olfactory/gustatory hallucinations, deja vu, rising epigastric sensation, focal numbness/tingling/weakness, myoclonic jerks. He has had headaches since starting the Levetiracetam. No dizziness, diplopia, dysarthria/dysphagia, neck/back pain, bowel/bladder dysfunction. His mother reports significant mood side effects on the Levetiracetam, he is "so ill and hateful." He states prior to LEV, mood was for the most part okay, his mother reports he is temperamental but not like it is now. Now he states his mood is lousy. He gets 8 hours of sleep, denies any sleep deprivation or alcohol intake prior to the seizure. He lives with his parents and is currently not working.   Epilepsy Risk Factors:  He had a normal early development.  There is no history of febrile convulsions, CNS infections such as meningitis/encephalitis, significant traumatic brain injury, neurosurgical procedures, or family history of seizures.  Prior AEDs: Dilantin  PAST MEDICAL HISTORY: Past Medical History:  Diagnosis Date   Seizures (HCC)     PAST SURGICAL HISTORY: Past Surgical History:  Procedure Laterality Date   TYMPANOSTOMY TUBE PLACEMENT      MEDICATIONS: Current Outpatient Medications on File Prior to Visit  Medication Sig Dispense Refill   aspirin 325 MG tablet Take 325 mg by mouth every 4 (four) hours as needed for headache.     diazepam (VALIUM) 5 MG tablet Take 1  tablet (5 mg total) by mouth every 6 (six) hours as needed for anxiety (spasms). 10 tablet 0   HYDROcodone-acetaminophen (NORCO/VICODIN) 5-325 MG per tablet Take 1 tablet by mouth every 6 (six) hours as needed for severe pain. 10 tablet 0   ibuprofen (ADVIL,MOTRIN) 600 MG tablet Take 1 tablet (600 mg total) by mouth once. 30 tablet 0   levETIRAcetam (KEPPRA) 500 MG tablet Take 1  tablet (500 mg total) by mouth 2 (two) times daily. 60 tablet 0   midazolam (VERSED) 5 MG/ML injection Place 1 mL (5 mg total) into the nose once for 1 dose. Draw up prescribed dose (ml) in syringe, remove blue vial access device, then attach syringe to nasal atomizer for intranasal administration. 2 mL 0   omeprazole (PRILOSEC) 20 MG capsule Take 20 mg by mouth daily.     oxyCODONE-acetaminophen (PERCOCET) 5-325 MG tablet Take 1 tablet by mouth every 6 (six) hours as needed. 15 tablet 0   No current facility-administered medications on file prior to visit.    ALLERGIES: No Known Allergies  FAMILY HISTORY: No family history on file.  SOCIAL HISTORY: Social History   Socioeconomic History   Marital status: Single    Spouse name: Not on file   Number of children: Not on file   Years of education: Not on file   Highest education level: Not on file  Occupational History   Not on file  Tobacco Use   Smoking status: Every Day    Types: Cigars   Smokeless tobacco: Never  Vaping Use   Vaping Use: Never used  Substance and Sexual Activity   Alcohol use: Yes    Comment: Socially   Drug use: No   Sexual activity: Not on file  Other Topics Concern   Not on file  Social History Narrative   Not on file   Social Determinants of Health   Financial Resource Strain: Not on file  Food Insecurity: Not on file  Transportation Needs: Not on file  Physical Activity: Not on file  Stress: Not on file  Social Connections: Not on file  Intimate Partner Violence: Not on file     PHYSICAL EXAM: Vitals:   04/14/21 1301  BP: (!) 125/54  Pulse: 63  SpO2: 100%   General: No acute distress Head:  Normocephalic/atraumatic Skin/Extremities: No rash, no edema Neurological Exam: Mental status: alert and oriented to person, place, and time, no dysarthria or aphasia, Fund of knowledge is appropriate.  Recent and remote memory are intact, 3/3 delayed recall.  Attention and concentration are  normal, 5/5 WORLD backwards.  Cranial nerves: CN I: not tested CN II: pupils equal, round and reactive to light, visual fields intact CN III, IV, VI:  full range of motion, no nystagmus, no ptosis CN V: facial sensation intact CN VII: upper and lower face symmetric CN VIII: hearing intact to conversation Bulk & Tone: normal, no fasciculations. Motor: 5/5 throughout with no pronator drift. Sensation: intact to light touch, cold, pin, vibration and joint position sense.  No extinction to double simultaneous stimulation.  Romberg test negative Deep Tendon Reflexes: +2 throughout Cerebellar: no incoordination on finger to nose testing Gait: narrow-based and steady, able to tandem walk adequately. Tremor: none   IMPRESSION: This is a 35 year old left-handed man with a history of one episode of status epilepticus at age 53 requiring intubation and treated with Dilantin for 2 years, off medication since age 31 and seizure-free for 29 years until 04/06/21. Semiology with  right head turn suggestive of left hemisphere onset, etiology unclear. MRI brain with and without contrast and 1-hour EEG will be ordered for seizure classification. If normal, we will do a 24-hour EEG. He is having significant mood changes with Levetiracetam, we discussed switching to Lamotrigine, uptitration schedule given, side effects including Levonne Spiller syndrome discussed. Once on Lamotrigine 50mg  BID, he will start weaning Levetiracetam. [place] driving laws were discussed with the patient, and he knows to stop driving after a seizure, until 6 months seizure-free. Follow-up in 3 months, call for any changes.    Thank you for allowing me to participate in the care of this patient. Please do not hesitate to call for any questions or concerns.   , M.D.  CC: Dr. Patrcia Dolly, Azucena Cecil, PA-C

## 2021-04-14 NOTE — Patient Instructions (Addendum)
Schedule MRI brain with and without contrast  2. Schedule 1-hour EEG, if normal we will do a 24-hour EEG  3. Start Lamotrigine 25mg : take 1 tablet twice a day for 2 weeks, then increase to 2 tablets twice a day for 2 weeks. After you are done with this bottle, start Lamotrigine 100mg : 1 tablet twice a day  4. Continue Keppra 500mg  twice a day for another 2 weeks, then reduce Keppra to 1 tablet at night for 2 weeks, then stop it once you start the Lamotrigine 100mg  tablets.  5. Follow-up in 3 months, call for any changes   Seizure Precautions: 1. If medication has been prescribed for you to prevent seizures, take it exactly as directed.  Do not stop taking the medicine without talking to your doctor first, even if you have not had a seizure in a long time.   2. Avoid activities in which a seizure would cause danger to yourself or to others.  Don't operate dangerous machinery, swim alone, or climb in high or dangerous places, such as on ladders, roofs, or girders.  Do not drive unless your doctor says you may.  3. If you have any warning that you may have a seizure, lay down in a safe place where you can't hurt yourself.    4.  No driving for 6 months from last seizure, as per [facility].   Please refer to the following link on the [employer]ile[employer] of [employer]'s website for more information: http://www.epilepsyfoundation.org/answerplace/Social/driving/drivingu.cfm   5.  Maintain good sleep hygiene. Avoid alcohol.  6.  Contact your doctor if you have any problems that may be related to the medicine you are taking.  7.  Call 911 and bring the patient back to the ED if:        A.  The seizure lasts longer than 5 minutes.       B.  The patient doesn't awaken shortly after the seizure  C.  The patient has new problems such as difficulty seeing, speaking or moving  D.  The patient was injured during the seizure  E.  The patient has a temperature over 102 F (39C)  F.  The  patient vomited and now is having trouble breathing

## 2021-04-16 ENCOUNTER — Telehealth: Payer: Self-pay | Admitting: Neurology

## 2021-04-16 MED ORDER — LEVETIRACETAM 500 MG PO TABS: 500.0000 mg | ORAL_TABLET | Freq: Two times a day (BID) | ORAL | 2 refills | Status: DC

## 2021-04-16 NOTE — Telephone Encounter (Signed)
Pt called in stating he has decided to stay on the Keppra. He also will need a refill for it.

## 2021-04-16 NOTE — Telephone Encounter (Signed)
Ok, he can start taking Keppra 500mg  twice a day again, monitor mood. Pls send in refills, thanks

## 2021-04-16 NOTE — Telephone Encounter (Signed)
Called and spoke to patient and informed him that Per Dr. Karel Jarvis he can start taking Keppra 500 mg twice a day and to monitor his mood. Patient was informed that refills have been sent in. Patient verbalized understanding and had no further questions or concerns.

## 2021-04-20 ENCOUNTER — Ambulatory Visit: Payer: BC Managed Care – PPO | Admitting: Neurology

## 2021-04-26 ENCOUNTER — Ambulatory Visit: Payer: BC Managed Care – PPO | Admitting: Neurology

## 2021-05-11 ENCOUNTER — Other Ambulatory Visit: Payer: Self-pay

## 2021-05-11 ENCOUNTER — Ambulatory Visit (INDEPENDENT_AMBULATORY_CARE_PROVIDER_SITE_OTHER): Payer: BC Managed Care – PPO | Admitting: Neurology

## 2021-05-11 DIAGNOSIS — R569 Unspecified convulsions: Secondary | ICD-10-CM | POA: Diagnosis not present

## 2021-05-15 ENCOUNTER — Other Ambulatory Visit: Payer: Self-pay

## 2021-05-15 ENCOUNTER — Encounter (HOSPITAL_COMMUNITY): Payer: Self-pay | Admitting: *Deleted

## 2021-05-15 ENCOUNTER — Emergency Department (HOSPITAL_COMMUNITY)
Admission: EM | Admit: 2021-05-15 | Discharge: 2021-05-15 | Disposition: A | Discharge status: Home / Self Care | Payer: BC Managed Care – PPO | Attending: Emergency Medicine | Admitting: Emergency Medicine

## 2021-05-15 DIAGNOSIS — R8271 Bacteriuria: Secondary | ICD-10-CM | POA: Insufficient documentation

## 2021-05-15 DIAGNOSIS — R569 Unspecified convulsions: Secondary | ICD-10-CM | POA: Diagnosis not present

## 2021-05-15 DIAGNOSIS — R9431 Abnormal electrocardiogram [ECG] [EKG]: Secondary | ICD-10-CM | POA: Diagnosis not present

## 2021-05-15 LAB — RAPID URINE DRUG SCREEN, HOSP PERFORMED
Amphetamines: NOT DETECTED
Barbiturates: NOT DETECTED
Benzodiazepines: NOT DETECTED
Cocaine: NOT DETECTED
Opiates: NOT DETECTED
Tetrahydrocannabinol: POSITIVE — AB

## 2021-05-15 LAB — COMPREHENSIVE METABOLIC PANEL
ALT: 12 U/L (ref 0–44)
AST: 17 U/L (ref 15–41)
Albumin: 3.6 g/dL (ref 3.5–5.0)
Alkaline Phosphatase: 49 U/L (ref 38–126)
Anion gap: 5 (ref 5–15)
BUN: 11 mg/dL (ref 6–20)
CO2: 26 mmol/L (ref 22–32)
Calcium: 8.5 mg/dL — ABNORMAL LOW (ref 8.9–10.3)
Chloride: 109 mmol/L (ref 98–111)
Creatinine, Ser: 1.12 mg/dL (ref 0.61–1.24)
GFR, Estimated: >60 mL/min (ref >60)
Glucose, Bld: 92 mg/dL (ref 70–99)
Potassium: 3.8 mmol/L (ref 3.5–5.1)
Sodium: 140 mmol/L (ref 135–145)
Total Bilirubin: 0.5 mg/dL (ref 0.3–1.2)
Total Protein: 5.9 g/dL — ABNORMAL LOW (ref 6.5–8.1)

## 2021-05-15 LAB — CBC WITH DIFFERENTIAL/PLATELET
Abs Immature Granulocytes: 0.01 10*3/uL (ref 0.00–0.07)
Basophils Absolute: 0.1 10*3/uL (ref 0.0–0.1)
Basophils Relative: 1 %
Eosinophils Absolute: 0.5 10*3/uL (ref 0.0–0.5)
Eosinophils Relative: 7 %
HCT: 42.3 % (ref 39.0–52.0)
Hemoglobin: 14.6 g/dL (ref 13.0–17.0)
Immature Granulocytes: 0 %
Lymphocytes Relative: 33 %
Lymphs Abs: 2.4 10*3/uL (ref 0.7–4.0)
MCH: 32.7 pg (ref 26.0–34.0)
MCHC: 34.5 g/dL (ref 30.0–36.0)
MCV: 94.6 fL (ref 80.0–100.0)
Monocytes Absolute: 0.5 10*3/uL (ref 0.1–1.0)
Monocytes Relative: 7 %
Neutro Abs: 3.8 10*3/uL (ref 1.7–7.7)
Neutrophils Relative %: 52 %
Platelets: 166 10*3/uL (ref 150–400)
RBC: 4.47 MIL/uL (ref 4.22–5.81)
RDW: 11.9 % (ref 11.5–15.5)
WBC: 7.3 10*3/uL (ref 4.0–10.5)
nRBC: 0 % (ref 0.0–0.2)

## 2021-05-15 LAB — URINALYSIS, ROUTINE W REFLEX MICROSCOPIC
Bilirubin Urine: NEGATIVE
Glucose, UA: NEGATIVE mg/dL
Ketones, ur: NEGATIVE mg/dL
Leukocytes,Ua: NEGATIVE
Nitrite: NEGATIVE
Protein, ur: NEGATIVE mg/dL
Specific Gravity, Urine: 1.019 (ref 1.005–1.030)
pH: 5 (ref 5.0–8.0)

## 2021-05-15 LAB — MAGNESIUM: Magnesium: 2 mg/dL (ref 1.7–2.4)

## 2021-05-15 MED ORDER — LEVETIRACETAM 750 MG PO TABS
750.0000 mg | ORAL_TABLET | Freq: Once | ORAL | Status: AC
  Administered 2021-05-15: 750 mg via ORAL
  Filled 2021-05-15: qty 1

## 2021-05-15 NOTE — Discharge Instructions (Signed)
Do not drive until you are seizure-free for 6 months.  You may increase your Keppra to 1-1/2 tablets (750mg ) twice daily or until neurology instructs you to change your dose.

## 2021-05-15 NOTE — ED Provider Notes (Signed)
[facility] EMERGENCY DEPARTMENT Provider Note   CSN: [id number] Arrival date & time: 05/15/21  0531     History  Chief Complaint  Patient presents with   Seizures    Kevin Roth is a 36 y.o. male.  The history is provided by the patient, the EMS personnel and medical records.  Seizures Kevin Roth is a 36 y.o. male who presents to the Emergency Department complaining of seizure.  He presents emergency department by EMS for evaluation following seizure activity.  He was diagnosed with seizures in December and started on Keppra.  He has been compliant with the Keppra since that time.  He was in bed when he had 30 seconds of tonic-clonic activity, noticed by his significant other.  They called EMS.  EMS reports that he was postictal on their arrival with progressive improvement in his mental status.  CBG of 95.  Patient denies any recent illnesses.  He is compliant with his medications.  No increased stress in his life.  He did bite his tongue, no additional injuries from the seizure.    Home Medications Prior to Admission medications   Medication Sig Start Date End Date Taking? Authorizing Provider  ibuprofen (ADVIL,MOTRIN) 600 MG tablet Take 1 tablet (600 mg total) by mouth once. 12/21/13   [name], NP  lamoTRIgine (LAMICTAL) 100 MG tablet Once done with Lamotrigine 25mg  tablets, start Lamotrigine 100mg : take 1 tablet twice a day 04/14/21   , MD  lamoTRIgine (LAMICTAL) 25 MG tablet Lamotrigine 25mg  tablet: Take 1 tablet twice a day for 2 weeks, then increase to 2 tablets twice a day for 2 weeks, then start the Lamotrigine 100mg  tablets 04/14/21   Van Clines, MD  levETIRAcetam (KEPPRA) 500 MG tablet Take 1 tablet (500 mg total) by mouth 2 (two) times daily. 04/16/21 05/16/21  04/16/21, MD      Allergies    Patient has no known allergies.    Review of Systems   Review of Systems  Neurological:  Positive for seizures.  All other  systems reviewed and are negative.  Physical Exam Updated Vital Signs BP 120/75    Pulse 81    Temp 98.2 F (36.8 C)    Resp 16    SpO2 99%  Physical Exam Vitals and nursing note reviewed.  Constitutional:      Appearance: He is well-developed.  HENT:     Head: Normocephalic and atraumatic.     Comments: Contusion with ecchymosis to the lateral aspect of the left anterior tongue Cardiovascular:     Rate and Rhythm: Normal rate and regular rhythm.  Pulmonary:     Effort: Pulmonary effort is normal. No respiratory distress.  Abdominal:     Palpations: Abdomen is soft.     Tenderness: There is no abdominal tenderness. There is no guarding or rebound.  Musculoskeletal:        General: No tenderness.  Skin:    General: Skin is warm and dry.  Neurological:     Mental Status: He is alert and oriented to person, place, and time.     Comments: No asymmetry of facial movements.  5 out of 5 strength in all 4 extremities with sensation to light touch intact in all 4 extremities  Psychiatric:        Behavior: Behavior normal.    ED Results / Procedures / Treatments   Labs (all labs ordered are listed, but only abnormal results are displayed)  Labs Reviewed  COMPREHENSIVE METABOLIC PANEL  CBC WITH DIFFERENTIAL/PLATELET  MAGNESIUM  URINALYSIS, ROUTINE W REFLEX MICROSCOPIC  RAPID URINE DRUG SCREEN, HOSP PERFORMED    EKG EKG Interpretation  Date/Time:  Saturday May 15 2021 05:36:35 EST Ventricular Rate:  71 PR Interval:  132 QRS Duration: 92 QT Interval:  372 QTC Calculation: 405 R Axis:   41 Text Interpretation: Sinus rhythm Confirmed by Tilden Fossa [phone]) on 05/15/2021 5:39:13 AM  Radiology No results found.  Procedures Procedures    Medications Ordered in ED Medications - No data to display  ED Course/ Medical Decision Making/ A&P                           Medical Decision Making Amount and/or Complexity of Data Reviewed Labs:  ordered.  Risk Prescription drug management.   Patient with recent diagnosis of seizure 1 month ago currently on Keppra 500 mg twice daily here for evaluation following recurrent seizure.  No clear trigger for today's event, he is compliant with his medication.  Labs are unremarkable aside from bacteria present in his urine.  He has no current urinary symptoms.  We will send a culture and would recommend treating if this comes back positive.  He is at his neurologic baseline in the emergency department.  He is planned for follow-up with MRI on Tuesday followed by 24-hour EEG in March.  Plan to discharge home with plan for neurology follow-up.  Pending his follow-up recommend that he increase his Keppra to 750 mg twice daily if tolerated and his neurologist may make further adjustments to his medications.  Discussed driving precautions.    Records reviewed in epic.       Final Clinical Impression(s) / ED Diagnoses Final diagnoses:  None    Rx / DC Orders ED Discharge Orders     None         Tilden Fossa, MD 05/15/21 [phone]

## 2021-05-15 NOTE — ED Triage Notes (Signed)
Pt arrived with [facility] for seizure witness by significant other. Described as tonic clonic lasting ~30seconds with tongue trauma, postical on EMS arrival, currently A&Ox4. Hx of one previous seizure in December, started on Keppra, reports compliance with medications. Has had a recent EEG and has a scheduled MRI  EMS VS 124/81 98 Cbg 95 18g R AC

## 2021-05-16 LAB — URINE CULTURE: Culture: NO GROWTH

## 2021-05-17 ENCOUNTER — Telehealth: Payer: Self-pay | Admitting: Neurology

## 2021-05-17 NOTE — Telephone Encounter (Signed)
Pt mother called stating Gaberiel had a seizure sat morning.  The hospital informed him to let his Dr know in case she wants to see him.  They also increased his medicine.  She also stated that they have not heard anything about his results from his EEG last week.

## 2021-05-18 ENCOUNTER — Ambulatory Visit
Admission: RE | Admit: 2021-05-18 | Discharge: 2021-05-18 | Disposition: A | Discharge status: Home / Self Care | Payer: BC Managed Care – PPO | Source: Ambulatory Visit | Attending: Neurology | Admitting: Neurology

## 2021-05-18 ENCOUNTER — Other Ambulatory Visit: Payer: Self-pay

## 2021-05-18 DIAGNOSIS — R569 Unspecified convulsions: Secondary | ICD-10-CM | POA: Diagnosis not present

## 2021-05-18 MED ORDER — LEVETIRACETAM 500 MG PO TABS: ORAL_TABLET | ORAL | 2 refills | Status: DC

## 2021-05-18 MED ORDER — GADOBENATE DIMEGLUMINE 529 MG/ML IV SOLN
18.0000 mL | Freq: Once | INTRAVENOUS | Status: AC | PRN
  Administered 2021-05-18: 18 mL via INTRAVENOUS

## 2021-05-18 NOTE — Telephone Encounter (Signed)
Pls let him know the EEG is normal, however it is not like a pregnancy test that is positive or negative, just a snapshot of his brain waves. Agree with increasing Keppra dose to 500mg : take 1 and 1/2 tabs BID. Pls send in updated Rx. Proceed with 24-hour EEG as discussed. Thanks

## 2021-05-18 NOTE — Addendum Note (Signed)
Addended by: Dimas Chyle on: 05/18/2021 02:01 PM   Modules accepted: Orders

## 2021-05-18 NOTE — Telephone Encounter (Signed)
Pt mother called an informed EEG is normal, however it is not like a pregnancy test that is positive or negative, just a snapshot of his brain waves. Agree with increasing Keppra dose to 500mg : take 1 and 1/2 tabs BID. Pls send in updated Rx. Proceed with 24-hour EEG as discussed. Pt mom stated that they are checking him for a bacterial infection they are waiting for the results,

## 2021-05-18 NOTE — Procedures (Signed)
ELECTROENCEPHALOGRAM REPORT  Date of Study: 05/11/2021  Patient's Name: Kevin Roth MRN: [id number] Date of Birth: 1985-09-14  Referring Provider: Dr. Patrcia Dolly  Clinical History: This is a 36 year old man seizure-free for 29 years off medication until seizure with right head turn in 03/2021. EEG for classification.  Medications: Lamotrigine Levetiracetam  Technical Summary: A multichannel digital 1-hour EEG recording measured by the international 10-20 system with electrodes applied with paste and impedances below 5000 ohms performed in our laboratory with EKG monitoring in an awake and asleep patient.  Hyperventilation was not performed. Photic stimulation was performed.  The digital EEG was referentially recorded, reformatted, and digitally filtered in a variety of bipolar and referential montages for optimal display.    Description: The patient is awake and asleep during the recording.  During maximal wakefulness, there is a symmetric, medium voltage 10 Hz posterior dominant rhythm that attenuates with eye opening.  The record is symmetric.  During drowsiness and sleep, there is an increase in theta slowing of the background.  Vertex waves and symmetric sleep spindles were seen.  Photic stimulation did not elicit any abnormalities.  There were no epileptiform discharges or electrographic seizures seen.    EKG lead showed irregular rhythm.  Impression: This 1-hour awake and asleep EEG is normal.    Clinical Correlation: A normal EEG does not exclude a clinical diagnosis of epilepsy.  If further clinical questions remain, prolonged EEG may be helpful.  Clinical correlation is advised.   Patrcia Dolly, M.D.

## 2021-05-21 ENCOUNTER — Telehealth: Payer: Self-pay

## 2021-05-21 NOTE — Telephone Encounter (Signed)
Spoke with pt mother informed her the brain MRI looked fine, no evidence of tumor, stroke, or bleed. Proceed with prolonged EEG as scheduled

## 2021-05-21 NOTE — Telephone Encounter (Signed)
-----   Message from Van Clines, MD sent at 05/20/2021 12:46 PM EST ----- Pls let him know the brain MRI looked fine, no evidence of tumor, stroke, or bleed. Proceed with prolonged EEG as scheduled. Thanks

## 2021-06-16 ENCOUNTER — Ambulatory Visit (INDEPENDENT_AMBULATORY_CARE_PROVIDER_SITE_OTHER): Payer: BC Managed Care – PPO | Admitting: Neurology

## 2021-06-16 ENCOUNTER — Other Ambulatory Visit: Payer: Self-pay

## 2021-06-16 DIAGNOSIS — R569 Unspecified convulsions: Secondary | ICD-10-CM | POA: Diagnosis not present

## 2021-06-17 ENCOUNTER — Telehealth: Payer: Self-pay | Admitting: Neurology

## 2021-06-17 NOTE — Telephone Encounter (Signed)
Patient wants to check on the status of a form he dropped off yesterday for completion. ?

## 2021-06-17 NOTE — Telephone Encounter (Signed)
Pt called informed that Dr Karel Jarvis has his paperwork our office policy is 7-10 days for completion, pt advised that I will call him when they are completed for him to come by and pick up. Pt verbalized understanding.  ?

## 2021-06-20 NOTE — Telephone Encounter (Signed)
Pls let him know I have the form but I would like to clarify what he needs in the letter, do they just need the diagnosis? Has he had any seizures since 05/15/2021? It looks like it is needing a letter that he is unable to do work, he can work but he cannot drive. Does he need me to write that he cannot drive? Thanks ?

## 2021-06-21 NOTE — Telephone Encounter (Signed)
Pt called he said that in his letter he needs a DX of the seizures. ?Pt stated last seizure 05/15/2021 ?The other letter he said was needed for an extension for a test at work. He stated that his short term memory is bad because of the seizures and this is causing him to not be able to pass the test ?

## 2021-06-28 MED ORDER — LEVETIRACETAM 1000 MG PO TABS: 1000.0000 mg | ORAL_TABLET | Freq: Two times a day (BID) | ORAL | 5 refills | Status: DC

## 2021-06-28 NOTE — Telephone Encounter (Signed)
Spoke to patient and mother. Discussed EEG results, there were brief 30-second left temporal seizures when he pushed button twice, had sensation of motion sickness, swimmyheadedness, still alert, a little turning of stomach. The 10:30pm seizure, he was watching an episode of Home Improvement, gave him a flashback of time before. No confusion, able to talk. No olfactory/hallucination, no focal sx. Felt fine after. Mother denies any staring/unresponsive episodes. Discussed increasing Levetiracetam to 1000mg  BID. Updated Rx sent. Advised to keep a seizure diary, f/u as scheduled this month.  ?

## 2021-07-06 ENCOUNTER — Encounter: Payer: Self-pay | Admitting: Neurology

## 2021-07-06 ENCOUNTER — Telehealth: Payer: Self-pay

## 2021-07-06 NOTE — Telephone Encounter (Signed)
Pt called spoke with his mother and informed her that paperwork he requested is ready an at the front desk.  ?

## 2021-07-06 NOTE — Telephone Encounter (Signed)
Form done, thanks ?

## 2021-07-16 ENCOUNTER — Ambulatory Visit: Payer: BC Managed Care – PPO | Admitting: Neurology

## 2021-07-16 ENCOUNTER — Encounter: Payer: Self-pay | Admitting: Neurology

## 2021-07-16 VITALS — BP 119/72 | HR 67 | Ht 74.0 in | Wt 199.6 lb

## 2021-07-16 DIAGNOSIS — G40009 Localization-related (focal) (partial) idiopathic epilepsy and epileptic syndromes with seizures of localized onset, not intractable, without status epilepticus: Secondary | ICD-10-CM | POA: Diagnosis not present

## 2021-07-16 MED ORDER — LEVETIRACETAM 1000 MG PO TABS: 1000.0000 mg | ORAL_TABLET | Freq: Two times a day (BID) | ORAL | 3 refills | Status: DC

## 2021-07-16 NOTE — Patient Instructions (Signed)
Good to see you doing well. Continue Keppra (Levetiracetam) 1000mg  twice a day. Keep a calendar of your symptoms. Follow-up in 4 months, call for any changes. ? ? ?Seizure Precautions: ?1. If medication has been prescribed for you to prevent seizures, take it exactly as directed.  Do not stop taking the medicine without talking to your doctor first, even if you have not had a seizure in a long time.  ? ?2. Avoid activities in which a seizure would cause danger to yourself or to others.  Don't operate dangerous machinery, swim alone, or climb in high or dangerous places, such as on ladders, roofs, or girders.  Do not drive unless your doctor says you may. ? ?3. If you have any warning that you may have a seizure, lay down in a safe place where you can't hurt yourself.   ? ?4.  No driving for 6 months from last seizure, as per [facility].   Please refer to the following link on the [employer] website for more information: http://www.epilepsyfoundation.org/answerplace/Social/driving/drivingu.cfm  ? ?5.  Maintain good sleep hygiene. Avoid alcohol. ? ?6.  Contact your doctor if you have any problems that may be related to the medicine you are taking. ? ?7.  Call 911 and bring the patient back to the ED if: ?      ? A.  The seizure lasts longer than 5 minutes.      ? B.  The patient doesn't awaken shortly after the seizure ? C.  The patient has new problems such as difficulty seeing, speaking or moving ? D.  The patient was injured during the seizure ? E.  The patient has a temperature over 102 F (39C) ? F.  The patient vomited and now is having trouble breathing ?      ? ?

## 2021-07-16 NOTE — Procedures (Signed)
ELECTROENCEPHALOGRAM REPORT ? ?Dates of Recording: 06/16/2021 11:29AM to 06/17/2021 12:01PM ? ?Patient's Name: Kevin Roth ?MRN: [id number] ?Date of Birth: 09-24-1985 ? ?Referring Provider: Dr. Patrcia Dolly ? ?Procedure: 24-hour ambulatory video EEG ? ?History: This is a 36 year old man with seizure-free off medication for over 20 years with recent seizure. EEG for classification. ? ?Medications:  ?KEPPRA 500 MG tablet ?aspirin 325 MG tablet ?VALIUM 5 MG tablet ?NORCO/VICODIN 5-325 MG per tablet ?ADVIL,MOTRIN 600 MG tablet ?VERSED 5 MG/ML injection ?PRILOSEC 20 MG capsule ?PERCOCET 5-325 MG tablet ? ?Technical Summary: ?This is a 24-hour multichannel digital video EEG recording measured by the international 10-20 system with electrodes applied with paste and impedances below 5000 ohms performed as portable with EKG monitoring.  The digital EEG was referentially recorded, reformatted, and digitally filtered in a variety of bipolar and referential montages for optimal display.   ? ?DESCRIPTION OF RECORDING: ?During maximal wakefulness, the background activity consisted of a symmetric 11 Hz posterior dominant rhythm which was reactive to eye opening.  There is occasional focal 4-5 Hz theta slowing over the left temporal region. There were no epileptiform discharges seen in wakefulness. ? ?During the recording, the patient progresses through wakefulness, drowsiness, and Stage 2 sleep.  Again, there were no epileptiform discharges seen. ? ?Events: ?On 3/1 at 1254 hours, he feels deja vu, swimmy headedness with symptoms of motion sickness with nausea. Patient not on video. Electrographically, there is rhythmic 4-5 Hz theta activity seen over the left frontotemporal region evolving in frequency lasting 30 seconds.  ? ?On 3/1 at 1800 hours, he feels the deja vu and swimmy headedness. Patient is not on video. Electrographically, rhythmic 4-5 Hz theta activity is seen over the left frontotemporal region for 16 seconds before he  pushes the event button. The seizure lasts 40 seconds.  ? ?On 3/1 at 2230 hours, he feels deja vu and swimmy headedness. He is lying on the bed, he looks at his watch and phone, no clinical changes seen. Electrographically, rhythmic 4-5 Hz theta activity is seen over the left frontotemporal region evolving in frequency lasting 30 seconds, he pushes the event button after the episode. ? ?On 3/2 at [phone] hours, he is lying on his right side facing away from the camera, asleep. No clinical changes noted. Electrographically, sleep architecture is seen, then rhythmic 4-5 Hz activity is seen over the left frontotemporal region evolving in frequency lasting 45 seconds. ? ?EKG lead was unremarkable. ? ?IMPRESSION: This 24-hour ambulatory video EEG study is abnormal due to the presence of: ?Occasional focal slowing over the left temporal region ?3 clinicoelectrographic seizures and 1 nocturnal electrographic seizure arising from the left frontotemporal region ? ?CLINICAL CORRELATION of the above findings indicates focal cerebral dysfunction over the left temporal region suggestive of structural or physiologic abnormality. There is a definite tendency for seizures to arise from the left frontotemporal region as noted above.  ? ? ?Patrcia Dolly, M.D. ? ?

## 2021-07-16 NOTE — Progress Notes (Signed)
? ?NEUROLOGY FOLLOW UP OFFICE NOTE ? ?Kevin Roth ?[id number] ?04/21/1985 ? ?HISTORY OF PRESENT ILLNESS: ?I had the pleasure of seeing Kevin Roth in follow-up in the neurology clinic on 07/16/2021.  The patient was last seen 3 months ago for new onset seizure. He is again accompanied by his mother who helps supplement the history today.  Records and images were personally reviewed where available.  I personally reviewed MRI brain with and without contrast done 05/2021, no acute changes, hippocampi symmetric with no abnormal signal or enhancement. He had another convulsion lasting 30 seconds on 05/15/21, Levetiracetam increased to 750mg  BID. He had a 24-hour EEG in 05/2021 which captured 2 30-second left temporal seizures during push button events for sensation of motion sickness, swimmyheadedness, little turning of the stomach, no loss of awareness. He had an episode of deja vu with one. His mother denied any staring episodes. Levetiracetam increased to 1000mg  BID earlier this month. Since then, he has not had any simple partial seizures with the motion sickness or deja vu feelings. He mostly feels tired with slight nausea and decreased appetite with the recent increase in Levetiracetam. He reports mood is okay, his mother states he gets a little moody "but has always been that way." He denies any headaches, dizziness, focal numbness/tingling/weakness. Sleep is good.  ? ? ?History on Initial Assessment 04/14/2021: This is a 36 year old left-handed man with a history of one episode of status epilepticus at age 89 requiring intubation and treated by Dr. Sharene SkeansHickling with Dilantin for 2 years, off medication since age 36 and seizure-free for 29 years until 04/06/21. He brings a bottle of apple butter and states that his mother made him apple butter toast that morning then he immediately got tired and lay back in bed. He fell asleep and woke up with his tongue sore. His body was not sore, no incontinence. Later that  afternoon, they went on errands for last minute Christmas shopping, his last memory was being at [employer]. [facility] does not recall [facility], he woke up in the ambulance. His mother reports he was fine the [facility], they got in the car for home and he started saying he had not felt like this in 15 years, like he was "starting to float." He was in the passenger seat and his mother noticed his speech was slurred then his head turned to the right and he had a generalized convulsion. He bit the right side of his tongue with profuse bleeding. He felt normal when he woke up in the ambulance, unaware of events, no focal weakness, incontinence. He felt sore later on. He was brought to the ER where CBC showed a WBC of 14. CMP normal, UDS positive for THC. I personally reviewed head CT without contrast which was unremarkable. He was discharged home on Levetiracetam 500mg  BID.  ? ?They deny any further seizures since 04/06/21. He denies any episodes of staring/unresponsiveness, gaps in time, olfactory/gustatory hallucinations, deja vu, rising epigastric sensation, focal numbness/tingling/weakness, myoclonic jerks. He has had headaches since starting the Levetiracetam. No dizziness, diplopia, dysarthria/dysphagia, neck/back pain, bowel/bladder dysfunction. His mother reports significant mood side effects on the Levetiracetam, he is "so ill and hateful." He states prior to LEV, mood was for the most part okay, his mother reports he is temperamental but not like it is now. Now he states his mood is lousy. He gets 8 hours of sleep, denies any sleep deprivation or alcohol intake prior  to the seizure. He lives with his parents and is currently not working.  ? ?Epilepsy Risk Factors:  He had a normal early development.  There is no history of febrile convulsions, CNS infections such as meningitis/encephalitis, significant traumatic brain injury, neurosurgical procedures, or family history of  seizures. ? ?Prior AEDs: Dilantin ? ? ?PAST MEDICAL HISTORY: ?Past Medical History:  ?Diagnosis Date  ? Seizures (HCC)   ? ? ?MEDICATIONS: ?Current Outpatient Medications on File Prior to Visit  ?Medication Sig Dispense Refill  ? ibuprofen (ADVIL,MOTRIN) 600 MG tablet Take 1 tablet (600 mg total) by mouth once. (Patient not taking: Reported on 05/15/2021) 30 tablet 0  ? levETIRAcetam (KEPPRA) 1000 MG tablet Take 1 tablet (1,000 mg total) by mouth 2 (two) times daily. This is the updated Rx as of 06/28/2021. Please cancel 500mg  Rx. 60 tablet 5  ? ?No current facility-administered medications on file prior to visit.  ? ? ?ALLERGIES: ?No Known Allergies ? ?FAMILY HISTORY: ?History reviewed. No pertinent family history. ? ?SOCIAL HISTORY: ?Social History  ? ?Socioeconomic History  ? Marital status: Single  ?  Spouse name: Not on file  ? Number of children: Not on file  ? Years of education: Not on file  ? Highest education level: Not on file  ?Occupational History  ? Not on file  ?Tobacco Use  ? Smoking status: Every Day  ?  Types: Cigars  ? Smokeless tobacco: Never  ?Vaping Use  ? Vaping Use: Never used  ?Substance and Sexual Activity  ? Alcohol use: Yes  ?  Comment: Socially  ? Drug use: No  ? Sexual activity: Not on file  ?Other Topics Concern  ? Not on file  ?Social History Narrative  ? Left handed   ? ?Social Determinants of Health  ? ?Financial Resource Strain: Not on file  ?Food Insecurity: Not on file  ?Transportation Needs: Not on file  ?Physical Activity: Not on file  ?Stress: Not on file  ?Social Connections: Not on file  ?Intimate Partner Violence: Not on file  ? ? ? ?PHYSICAL EXAM: ?Vitals:  ? 07/16/21 1429  ?BP: 119/72  ?Pulse: 67  ?SpO2: 99%  ? ?General: No acute distress ?Head:  Normocephalic/atraumatic ?Skin/Extremities: No rash, no edema ?Neurological Exam: alert and awake. No aphasia or dysarthria. Fund of knowledge is appropriate.  Attention and concentration are normal.   Cranial nerves: Pupils equal,  round. Extraocular movements intact with no nystagmus. Visual fields full.  No facial asymmetry.  Motor: Bulk and tone normal, muscle strength 5/5 throughout with no pronator drift.   Finger to nose testing intact.  Gait narrow-based and steady, able to tandem walk adequately.  Romberg negative. ? ? 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 He had an episode of status epilepticus at age 34 and was on Dilantin for 2 years, off medication and seizure-free for 29 years until he had convulsions in 03/2021 and 04/2021. His 24-hour EEG in 05/2021 captured 2 focal seizures without impaired awareness arising from the left temporal region. No convulsions since 05/15/2021, the simple partial seizures have quieted down with increase in Levetiracetam to 1000mg  BID. He is having minor side effects on it, continue to monitor. He was advised to keep a calendar of his symptoms. He is aware of [place] driving laws to stop driving until 6 months seizure-free. Follow-up in 4 months, call for any changes.  ? ? ? ?Thank you for allowing me to participate in his care.  Please do not hesitate to call for any questions or concerns. ? ? ? ?Patrcia Dolly, M.D. ? ? ?CC: Dr. Azucena Cecil ?

## 2021-08-06 ENCOUNTER — Emergency Department (HOSPITAL_COMMUNITY)
Admission: EM | Admit: 2021-08-06 | Discharge: 2021-08-06 | Disposition: A | Discharge status: Home / Self Care | Payer: BC Managed Care – PPO | Attending: Emergency Medicine | Admitting: Emergency Medicine

## 2021-08-06 ENCOUNTER — Other Ambulatory Visit: Payer: Self-pay

## 2021-08-06 ENCOUNTER — Telehealth: Payer: Self-pay

## 2021-08-06 ENCOUNTER — Telehealth: Payer: Self-pay | Admitting: Neurology

## 2021-08-06 DIAGNOSIS — I10 Essential (primary) hypertension: Secondary | ICD-10-CM | POA: Diagnosis not present

## 2021-08-06 DIAGNOSIS — R569 Unspecified convulsions: Secondary | ICD-10-CM | POA: Diagnosis not present

## 2021-08-06 LAB — COMPREHENSIVE METABOLIC PANEL
ALT: 15 U/L (ref 0–44)
AST: 19 U/L (ref 15–41)
Albumin: 3.8 g/dL (ref 3.5–5.0)
Alkaline Phosphatase: 42 U/L (ref 38–126)
Anion gap: 5 (ref 5–15)
BUN: 11 mg/dL (ref 6–20)
CO2: 24 mmol/L (ref 22–32)
Calcium: 9 mg/dL (ref 8.9–10.3)
Chloride: 112 mmol/L — ABNORMAL HIGH (ref 98–111)
Creatinine, Ser: 1.15 mg/dL (ref 0.61–1.24)
GFR, Estimated: >60 mL/min (ref >60)
Glucose, Bld: 92 mg/dL (ref 70–99)
Potassium: 4.1 mmol/L (ref 3.5–5.1)
Sodium: 141 mmol/L (ref 135–145)
Total Bilirubin: 0.9 mg/dL (ref 0.3–1.2)
Total Protein: 6.4 g/dL — ABNORMAL LOW (ref 6.5–8.1)

## 2021-08-06 LAB — CBC
HCT: 46.6 % (ref 39.0–52.0)
Hemoglobin: 15.4 g/dL (ref 13.0–17.0)
MCH: 32 pg (ref 26.0–34.0)
MCHC: 33 g/dL (ref 30.0–36.0)
MCV: 96.7 fL (ref 80.0–100.0)
Platelets: 163 10*3/uL (ref 150–400)
RBC: 4.82 MIL/uL (ref 4.22–5.81)
RDW: 12.1 % (ref 11.5–15.5)
WBC: 6.6 10*3/uL (ref 4.0–10.5)
nRBC: 0 % (ref 0.0–0.2)

## 2021-08-06 MED ORDER — LEVETIRACETAM IN NACL 1000 MG/100ML IV SOLN
1000.0000 mg | Freq: Once | INTRAVENOUS | Status: AC
Start: 2021-08-06 — End: 2021-08-06
  Administered 2021-08-06: 1000 mg via INTRAVENOUS
  Filled 2021-08-06: qty 100

## 2021-08-06 MED ORDER — LEVETIRACETAM 500 MG PO TABS: 500.0000 mg | ORAL_TABLET | Freq: Two times a day (BID) | ORAL | 0 refills | Status: DC

## 2021-08-06 MED ORDER — LEVETIRACETAM 250 MG PO TABS: 250.0000 mg | ORAL_TABLET | Freq: Two times a day (BID) | ORAL | 0 refills | Status: DC

## 2021-08-06 MED ORDER — LEVETIRACETAM IN NACL 1000 MG/100ML IV SOLN
1000.0000 mg | Freq: Once | INTRAVENOUS | Status: AC
  Administered 2021-08-06: 1000 mg via INTRAVENOUS
  Filled 2021-08-06: qty 100

## 2021-08-06 NOTE — ED Provider Notes (Signed)
?[facility] EMERGENCY DEPARTMENT ?Provider Note ? ? ?CSN: [id number] ?Arrival date & time: 08/06/21  2585 ? ?  ? ?History ? ?Chief Complaint  ?Patient presents with  ? Seizures  ? ? ?Kevin Roth is a 36 y.o. male. ? ? ?Seizures ?Patient presents after seizure.  Reported tonic-clonic seizure.  Lasted a minute.  Does have seizure history.  EEG was abnormal.  Is on Keppra 1000 mg twice a day.  Has had last night but not today.  Last seizure was in January with 1 in December before that.  Otherwise had been years since a seizure.  States he slept well last night.  States he woke up with EMS all around him and figured he had another seizure.  Reportedly may not have been postictal. ?  ? ?Home Medications ?Prior to Admission medications   ?Medication Sig Start Date End Date Taking? Authorizing Provider  ?levETIRAcetam (KEPPRA) 1000 MG tablet Take 1 tablet (1,000 mg total) by mouth 2 (two) times daily. Take 1 tablet twice a day ?Patient taking differently: Take 1,000 mg by mouth 2 (two) times daily. 07/16/21  Yes Van Clines, MD  ?levETIRAcetam (KEPPRA) 250 MG tablet Take 1 tablet (250 mg total) by mouth 2 (two) times daily. Take with 1000 mg tab for 1250 total 08/06/21   Benjiman Core, MD  ?   ? ?Allergies    ?Patient has no known allergies.   ? ?Review of Systems   ?Review of Systems  ?Constitutional:  Negative for appetite change.  ?Cardiovascular:  Negative for chest pain.  ?Gastrointestinal:  Negative for abdominal pain.  ?Skin:  Negative for rash.  ?Neurological:  Positive for seizures.  ?Psychiatric/Behavioral:  Negative for dysphoric mood.   ? ?Physical Exam ?Updated Vital Signs ?BP 128/79   Pulse 60   Temp 97.9 ?F (36.6 ?C) (Oral)   Resp 14   Ht 6\' 1"  (1.854 m)   Wt 89.4 kg   SpO2 100%   BMI 25.99 kg/m?  ?Physical Exam ?Vitals and nursing note reviewed.  ?HENT:  ?   Head: Atraumatic.  ?Cardiovascular:  ?   Rate and Rhythm: Regular rhythm.  ?Musculoskeletal:     ?   General: No  tenderness.  ?   Cervical back: Neck supple.  ?Skin: ?   General: Skin is warm.  ?Neurological:  ?   Mental Status: He is alert and oriented to person, place, and time.  ? ? ?ED Results / Procedures / Treatments   ?Labs ?(all labs ordered are listed, but only abnormal results are displayed) ?Labs Reviewed  ?COMPREHENSIVE METABOLIC PANEL - Abnormal; Notable for the following components:  ?    Result Value  ? Chloride 112 (*)   ? Total Protein 6.4 (*)   ? All other components within normal limits  ?CBC  ?LEVETIRACETAM LEVEL  ? ? ?EKG ?None ? ?Radiology ?No results found. ? ?Procedures ?Procedures  ? ? ?Medications Ordered in ED ?Medications  ?levETIRAcetam (KEPPRA) IVPB 1000 mg/100 mL premix (0 mg Intravenous Stopped 08/06/21 0829)  ?levETIRAcetam (KEPPRA) IVPB 1000 mg/100 mL premix (0 mg Intravenous Stopped 08/06/21 0813)  ? ? ?ED Course/ Medical Decision Making/ A&P ?  ?                        ?Medical Decision Making ?Amount and/or Complexity of Data Reviewed ?Labs: ordered. ? ?Risk ?Prescription drug management. ? ? ?Patient with reported witnessed tonic-clonic seizure.  History of seizures but  usually partial.  Has had previous tonic-clonic however.  On Keppra at thousand twice a day.  Reported compliant with it.  At baseline although with questionable postictal period.  No apparent injury.  Loaded with 2 g of Keppra to both elevate level and to bring up since he has not had his dose this morning.  Appears stable for follow-up with neurology.  I have reviewed her previous note.  Increase Keppra.  Outpatient follow-up as needed. ? ? ? ? ? ? ? ?Final Clinical Impression(s) / ED Diagnoses ?Final diagnoses:  ?Seizure (HCC)  ? ? ?Rx / DC Orders ?ED Discharge Orders   ? ?      Ordered  ?  levETIRAcetam (KEPPRA) 500 MG tablet  2 times daily,   Status:  Discontinued       ? 08/06/21 0906  ?  levETIRAcetam (KEPPRA) 250 MG tablet  2 times daily       ? 08/06/21 0907  ? ?  ?  ? ?  ? ? ?  ?Benjiman Core, MD ?08/06/21  1530 ? ?

## 2021-08-06 NOTE — Telephone Encounter (Signed)
Pls ask seizure trigger questions, thanks! ?

## 2021-08-06 NOTE — ED Notes (Signed)
ED Provider at bedside. 

## 2021-08-06 NOTE — Telephone Encounter (Signed)
Left message to contact office at 2:11 08/06/2021  ?

## 2021-08-06 NOTE — Telephone Encounter (Signed)
Needs hospital follow up appt. Wants to schedule an sooner appt. [phone].  ?

## 2021-08-06 NOTE — ED Triage Notes (Addendum)
Via EMS. Pt from home had witnessed seizure activity, with tonic/clonic movement last apx 1 min. Pt alert/oriented now. GCS 15. Hx of seizures, neuro Dr Karel Jarvis. Last seizure Janurary 2023--on Keppra and has been taking as prescribed.  ?

## 2021-08-06 NOTE — Telephone Encounter (Signed)
Patients mom called and stated he went to ED today, he had a seizure this morning before 6.  They told him to follow up with his Dr to maybe come in for a visit. ?

## 2021-08-06 NOTE — Discharge Instructions (Addendum)
Take the 250 mg tab with 1000 mg tab for 1250 twice a day. ?

## 2021-08-06 NOTE — Telephone Encounter (Signed)
Sent to  Dr.Aquino for sooner appt. Call mom back  ?

## 2021-08-09 LAB — LEVETIRACETAM LEVEL: Levetiracetam Lvl: 16 ug/mL (ref 10.0–40.0)

## 2021-08-09 NOTE — Telephone Encounter (Signed)
Pt c/o: seizure ?Missed medications?  no ?Sleep deprived?  no ?Alcohol intake?  no ?Back to their usual baseline self?  no. If no, advise go to ER ?Current medications prescribed by Dr. Karel Jarvis: 1250mg  Keprra  bid.  ?Requesting sooner appt. Please advise.  ?

## 2021-08-09 NOTE — Telephone Encounter (Signed)
Schedule for tomorrow 08/10/21. ?

## 2021-08-09 NOTE — Telephone Encounter (Signed)
Patient's mom called again to see if she can get her son in sooner with Dr. Delice Lesch. She is aware we are waiting to hear back from the doctor. ?

## 2021-08-09 NOTE — Telephone Encounter (Signed)
Ok for cancellation slot tomorrow, thanks ?

## 2021-08-10 ENCOUNTER — Ambulatory Visit: Payer: BC Managed Care – PPO | Admitting: Neurology

## 2021-08-10 ENCOUNTER — Encounter: Payer: Self-pay | Admitting: Neurology

## 2021-08-10 VITALS — BP 111/73 | HR 87 | Ht 73.0 in | Wt 203.0 lb

## 2021-08-10 DIAGNOSIS — G40009 Localization-related (focal) (partial) idiopathic epilepsy and epileptic syndromes with seizures of localized onset, not intractable, without status epilepticus: Secondary | ICD-10-CM

## 2021-08-10 MED ORDER — LEVETIRACETAM 1000 MG PO TABS: ORAL_TABLET | ORAL | 3 refills | Status: DC

## 2021-08-10 NOTE — Patient Instructions (Signed)
Increase Keppra to 1500mg  twice a day ? ?2. Follow-up as scheduled in July, call for any changes ? ? ?Seizure Precautions: ?1. If medication has been prescribed for you to prevent seizures, take it exactly as directed.  Do not stop taking the medicine without talking to your doctor first, even if you have not had a seizure in a long time.  ? ?2. Avoid activities in which a seizure would cause danger to yourself or to others.  Don't operate dangerous machinery, swim alone, or climb in high or dangerous places, such as on ladders, roofs, or girders.  Do not drive unless your doctor says you may. ? ?3. If you have any warning that you may have a seizure, lay down in a safe place where you can't hurt yourself.   ? ?4.  No driving for 6 months from last seizure, as per [facility].   Please refer to the following link on the Epile[employer] of [employer]'s website for more information: http://www.epilepsyfoundation.org/answerplace/Social/driving/drivingu.cfm  ? ?5.  Maintain good sleep hygiene. Avoid alcohol. ? ?6.  Contact your doctor if you have any problems that may be related to the medicine you are taking. ? ?7.  Call 911 and bring the patient back to the ED if: ?      ? A.  The seizure lasts longer than 5 minutes.      ? B.  The patient doesn't awaken shortly after the seizure ? C.  The patient has new problems such as difficulty seeing, speaking or moving ? D.  The patient was injured during the seizure ? E.  The patient has a temperature over 102 F (39C) ? F.  The patient vomited and now is having trouble breathing ?      ? ?

## 2021-08-10 NOTE — Progress Notes (Signed)
? ?NEUROLOGY FOLLOW UP OFFICE NOTE ? ?Kevin Roth ?[id number] ?1986/01/22 ? ?HISTORY OF PRESENT ILLNESS: ?I had the pleasure of seeing Kevin Roth in follow-up in the neurology clinic on 08/10/2021.  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 He presents for an earlier follow-up after ER visit for seizure on 08/06/2021. He is again accompanied by his mother who helps supplement the history today. Records and images were personally reviewed where available.  He had a nocturnal seizure witnessed by his girlfriend just before 6am. It lasted around 1 minute followed by sonorous respirations. He bit the right side of his tongue. No focal weakness, he felt numbness below both knees. He denied missing any doses of Levetiracetam 1000mg  BID (dose increased last month), no sleep deprivation or alcohol. Keppra level 16. CBC, CMP normal, UDS positive for THC. He provides additional information that he has been having the auras of deja vu intermittently for the past 15 years at least. Mood stable. ? ? ?History on Initial Assessment 04/14/2021: This is a 36 year old left-handed man with a history of one episode of status epilepticus at age 2 requiring intubation and treated by Dr. Gaynell Face with Dilantin for 2 years, off medication since age 64 and seizure-free for 29 years until 04/06/21. He brings a bottle of apple butter and states that his mother made him apple butter toast that morning then he immediately got tired and lay back in bed. He fell asleep and woke up with his tongue sore. His body was not sore, no incontinence. Later that afternoon, they went on errands for last minute Christmas shopping, his last memory was being at [employer]. He does not recall that they went to [facility] after, he woke up in the ambulance. His mother reports he was fine the whole time they were at [facility], they got in the car for home and he started saying he had not felt like this in 15 years, like he was "starting to float."  He was in the passenger seat and his mother noticed his speech was slurred then his head turned to the right and he had a generalized convulsion. He bit the right side of his tongue with profuse bleeding. He felt normal when he woke up in the ambulance, unaware of events, no focal weakness, incontinence. He felt sore later on. He was brought to the ER where CBC showed a WBC of 14. CMP normal, UDS positive for THC. I personally reviewed head CT without contrast which was unremarkable. He was discharged home on Levetiracetam 500mg  BID.  ? ?They deny any further seizures since 04/06/21. Over the past 15 years, he has noticed intermittent episodes of deja vu with motion sickness. He denies any episodes of staring/unresponsiveness, gaps in time, olfactory/gustatory hallucinations, rising epigastric sensation, focal numbness/tingling/weakness, myoclonic jerks. He has had headaches since starting the Levetiracetam. No dizziness, diplopia, dysarthria/dysphagia, neck/back pain, bowel/bladder dysfunction. His mother reports significant mood side effects on the Levetiracetam, he is "so ill and hateful." He states prior to LEV, mood was for the most part okay, his mother reports he is temperamental but not like it is now. Now he states his mood is lousy. He gets 8 hours of sleep, denies any sleep deprivation or alcohol intake prior to the seizure. He lives with his parents and is currently not working.  ? ?Epilepsy Risk Factors:  He had a normal early development.  There is no history of febrile convulsions, CNS infections such as meningitis/encephalitis, significant  traumatic brain injury, neurosurgical procedures, or family history of seizures. ? ?Prior AEDs: Dilantin ? ?Diagnostic Data: ?MRI brain with and without contrast done 05/2021 no acute changes, hippocampi symmetric with no abnormal signal or enhancement.  ?24-hour EEG in 05/2021 captured 2 30-second left temporal seizures during push button events for sensation of  motion sickness, swimmyheadedness, little turning of the stomach, no loss of awareness.  ? ?PAST MEDICAL HISTORY: ?Past Medical History:  ?Diagnosis Date  ? Seizures ([place])   ? ? ?MEDICATIONS: ?Current Outpatient Medications on File Prior to Visit  ?Medication Sig Dispense Refill  ? levETIRAcetam (KEPPRA) 1000 MG tablet Take 1 tablet (1,000 mg total) by mouth 2 (two) times daily. Take 1 tablet twice a day (Patient taking differently: Take 1,000 mg by mouth 2 (two) times daily.) 180 tablet 3  ? levETIRAcetam (KEPPRA) 250 MG tablet Take 1 tablet (250 mg total) by mouth 2 (two) times daily. Take with 1000 mg tab for 1250 total 60 tablet 0  ? ?No current facility-administered medications on file prior to visit.  ? ? ?ALLERGIES: ?No Known Allergies ? ?FAMILY HISTORY: ?History reviewed. No pertinent family history. ? ?SOCIAL HISTORY: ?Social History  ? ?Socioeconomic History  ? Marital status: Single  ?  Spouse name: Not on file  ? Number of children: Not on file  ? Years of education: Not on file  ? Highest education level: Not on file  ?Occupational History  ? Not on file  ?Tobacco Use  ? Smoking status: Every Day  ?  Types: Cigars  ? Smokeless tobacco: Never  ?Vaping Use  ? Vaping Use: Never used  ?Substance and Sexual Activity  ? Alcohol use: Yes  ?  Comment: Socially  ? Drug use: No  ? Sexual activity: Not on file  ?Other Topics Concern  ? Not on file  ?Social History Narrative  ? Left handed   ? ?Social Determinants of Health  ? ?Financial Resource Strain: Not on file  ?Food Insecurity: Not on file  ?Transportation Needs: Not on file  ?Physical Activity: Not on file  ?Stress: Not on file  ?Social Connections: Not on file  ?Intimate Partner Violence: Not on file  ? ? ? ?PHYSICAL EXAM: ?Vitals:  ? 08/10/21 1255  ?BP: 111/73  ?Pulse: 87  ?SpO2: 97%  ? ?General: No acute distress ?Head:  Normocephalic/atraumatic ?Skin/Extremities: No rash, no edema ?Neurological Exam: alert and awake. No aphasia or dysarthria. Fund of  knowledge is appropriate.  Attention and concentration are normal.   Cranial nerves: Pupils equal, round. Extraocular movements intact with no nystagmus. Visual fields full.  No facial asymmetry.  Motor: Bulk and tone normal, muscle strength 5/5 throughout with no pronator drift.   Finger to nose testing intact.  Gait narrow-based and steady, able to tandem walk adequately.   ? ? 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 He had an episode of status epilepticus at age 23 and was on Dilantin for 2 years, off medication and seizure-free for 29 years until he had convulsions in 03/2021, 04/2021, and most recently 08/06/21. His 24-hour EEG in 05/2021 captured 2 focal seizures without impaired awareness arising from the left temporal region. We discussed increasing Levetiracetam to 1500mg  BID. Check Keppra level in 2 weeks. Continue to monitor mood. Indications for calling EMS were discussed today. He is aware of [place] driving laws to stop driving until 6 months seizure-free. Follow-up as scheduled in 10/2021, they know to call for any changes. ? ? ?  Thank you for allowing me to participate in his care.  Please do not hesitate to call for any questions or concerns. ? ? ? ?Ellouise Newer, M.D. ? ? ?CC: Dr. Moreen Fowler ? ? ? ?

## 2021-08-11 ENCOUNTER — Telehealth: Payer: Self-pay | Admitting: Neurology

## 2021-08-11 DIAGNOSIS — Z79899 Other long term (current) drug therapy: Secondary | ICD-10-CM

## 2021-08-11 NOTE — Telephone Encounter (Signed)
Spoke with pt mother informed her that dr Karel Jarvis wants keppra level in 2 weeks order is placed, pt is to come before taken morning dose. Pt mother verbalized understanding  ?

## 2021-08-11 NOTE — Telephone Encounter (Signed)
Patients mother would like to know if kamori is supposed to get labs done to check in two weeks regarding his levels. She said aquino mentioned it but it was not on AVS ?

## 2021-08-19 ENCOUNTER — Telehealth: Payer: Self-pay | Admitting: Neurology

## 2021-08-19 ENCOUNTER — Other Ambulatory Visit: Payer: Self-pay

## 2021-08-19 DIAGNOSIS — F419 Anxiety disorder, unspecified: Secondary | ICD-10-CM

## 2021-08-19 DIAGNOSIS — F32A Depression, unspecified: Secondary | ICD-10-CM

## 2021-08-19 NOTE — Telephone Encounter (Signed)
Pt was called he stated that his stress comes form living with his parents , he said that he lives at home with them he is 62 and has no job but is trying to get one. He stated that his girl friend was a good support system and last week she and his parents got into and she had to move out. Pt is very upset that his "soul mate"  cant live with him now that his parents see her as trouble. He stated that his parents hinder him and causes his stress, depression and anxiety. Pt was advised that he could move out of his parents house to be with his girl friend and see if that helps. The stated " its hard when you done have a job" then he started to talk about getting on disability. Pt was advised that he needs to go to the social security office to talk to them about that. Pt was then asked if he sees a psychiatrist because of what all he was telling me he needs someone to talk to and I am not skilled nor trained to be a psychiatrist. He stated he would love to see one if his insurance will cover it, it was advised that I will send this note to Dr Karel Jarvis and see what she says and if she agrees with me   ?

## 2021-08-19 NOTE — Telephone Encounter (Signed)
Sounds like he needs counseling. Pls refer to Behavioral Health for depression, anxiety, stress management. Thanks ?

## 2021-08-19 NOTE — Telephone Encounter (Signed)
Patient would like a call back from nurse. He had a few questions.  ?

## 2021-08-20 NOTE — Telephone Encounter (Signed)
Pt called no answer left a voice mail per DPR  an informed that referral was placed  ?

## 2021-08-24 ENCOUNTER — Other Ambulatory Visit (INDEPENDENT_AMBULATORY_CARE_PROVIDER_SITE_OTHER): Payer: BC Managed Care – PPO

## 2021-08-24 DIAGNOSIS — Z79899 Other long term (current) drug therapy: Secondary | ICD-10-CM

## 2021-08-28 LAB — LEVETIRACETAM LEVEL: Keppra (Levetiracetam): 27.4 ug/mL

## 2021-09-30 ENCOUNTER — Encounter (HOSPITAL_COMMUNITY): Payer: Self-pay | Admitting: Emergency Medicine

## 2021-09-30 ENCOUNTER — Telehealth: Payer: Self-pay | Admitting: Neurology

## 2021-09-30 ENCOUNTER — Emergency Department (HOSPITAL_COMMUNITY)
Admission: EM | Admit: 2021-09-30 | Discharge: 2021-09-30 | Disposition: A | Discharge status: Home / Self Care | Payer: BC Managed Care – PPO | Attending: Emergency Medicine | Admitting: Emergency Medicine

## 2021-09-30 DIAGNOSIS — R569 Unspecified convulsions: Secondary | ICD-10-CM | POA: Diagnosis not present

## 2021-09-30 LAB — I-STAT CHEM 8, ED
BUN: 9 mg/dL (ref 6–20)
Calcium, Ion: 1.02 mmol/L — ABNORMAL LOW (ref 1.15–1.40)
Chloride: 110 mmol/L (ref 98–111)
Creatinine, Ser: 1.1 mg/dL (ref 0.61–1.24)
Glucose, Bld: 88 mg/dL (ref 70–99)
HCT: 45 % (ref 39.0–52.0)
Hemoglobin: 15.3 g/dL (ref 13.0–17.0)
Potassium: 3.9 mmol/L (ref 3.5–5.1)
Sodium: 141 mmol/L (ref 135–145)
TCO2: 22 mmol/L (ref 22–32)

## 2021-09-30 MED ORDER — SODIUM CHLORIDE 0.9 % IV SOLN: 2000.0000 mg | Freq: Once | INTRAVENOUS | Status: DC

## 2021-09-30 MED ORDER — LEVETIRACETAM IN NACL 1000 MG/100ML IV SOLN
1000.0000 mg | INTRAVENOUS | Status: AC
  Administered 2021-09-30 (×2): 1000 mg via INTRAVENOUS
  Filled 2021-09-30 (×2): qty 100

## 2021-09-30 MED ORDER — LEVETIRACETAM 1000 MG PO TABS: ORAL_TABLET | ORAL | 3 refills | Status: DC

## 2021-09-30 NOTE — ED Triage Notes (Signed)
Pt bib [employer] for seizure activity. Pt's mother found pt, suspected he had a seizure, and witnessed pt having another seizure as well. Second seizure lasted several minutes. Post-ictal on EMS arrival, A&O x4 on arrival to ED. Seizure history from childhood, no missed antiepileptic doses. Pt denies alcohol/drug use, but endorses recent stressors.   18G IV in [facility] vitals 118/70 HR 76 99% room air  CBG 93

## 2021-09-30 NOTE — Discharge Instructions (Signed)
Increase your Keppra dose to 2000 mg in the morning and at night Please call Dr. Oswaldo Done at today for further recommendation Continue seizure precautions including not driving or doing anything dangerous that could cause harm to yourself or others if another seizure was to occur Per [facility] statutes, patients with seizures are not allowed to drive until they have been seizure-free for six months. Use caution when using heavy equipment or power tools. Avoid working on ladders or at heights. Take showers instead of baths. Ensure the water temperature is not too high on the home water heater. Do not go swimming alone. Do not lock yourself in a room alone (i.e. bathroom). When caring for infants or small children, sit down when holding, feeding, or changing them to minimize risk of injury to the child in the event you have a seizure. Maintain good sleep hygiene. Avoid alcohol.    If Kevin Roth has another seizure, call 911 and bring them back to the ED if:       A.  The seizure lasts longer than 5 minutes.            B.  The patient doesn't wake shortly after the seizure or has new problems such as difficulty seeing, speaking or moving following the seizure       C.  The patient was injured during the seizure       D.  The patient has a temperature over 102 F (39C)       E.  The patient vomited during the seizure and now is having trouble breathing

## 2021-09-30 NOTE — ED Provider Notes (Signed)
[facility] EMERGENCY DEPARTMENT Provider Note   CSN: [id number] Arrival date & time: 09/30/21  7824     History  Chief Complaint  Patient presents with   Seizures    Kevin Roth is a 36 y.o. male.  HPI Level 5 caveat as patient is amnestic for event 36 year old male presents today with reports of likely grand mal seizure.  He was sleeping when his mother heard something and called 911.  She suspected that he had had a seizure and witnessed him having a another seizure.  EMS reports that he was postictal on arrival.  He is alert and oriented on arrival to the ED.  He has a history of seizures 1 time in childhood at age 31 and reports being on Dilantin for 2 years.  He has had a return of seizures since last December and had a seizure in December, January, and March.  He has been seen by Dr. Oswaldo Done.  He is on Keppra 1500 mg twice daily.  He reports that he has been taking all medications as prescribed.  He does not report any alcohol or drugs.  He has been sleeping well.  He does report some increased stress in his life that he feels may be impacting this.  He has no recall of the events until after EMS were there. EMS reported vital signs of 118/70, 99% oxygen saturations, CBG of 93 and heart rate of 76     Home Medications Prior to Admission medications   Medication Sig Start Date End Date Taking? Authorizing Provider  levETIRAcetam (KEPPRA) 1000 MG tablet Take 2 tablets two times daily 09/30/21   Margarita Grizzle, MD      Allergies    Patient has no known allergies.    Review of Systems   Review of Systems  All other systems reviewed and are negative.   Physical Exam Updated Vital Signs BP 110/75   Pulse (!) 58   Temp (!) 97.5 F (36.4 C) (Oral)   Resp 11   Ht 1.88 m (6\' 2" )   Wt 89.4 kg   SpO2 100%   BMI 25.29 kg/m  Physical Exam Vitals and nursing note reviewed.  Constitutional:      General: He is not in acute distress.    Appearance: Normal  appearance. He is normal weight.  HENT:     Head: Normocephalic.     Right Ear: External ear normal.     Left Ear: External ear normal.     Nose: Nose normal.     Mouth/Throat:     Mouth: Mucous membranes are moist.     Comments: Abrasion to tongue Eyes:     Extraocular Movements: Extraocular movements intact.     Pupils: Pupils are equal, round, and reactive to light.  Cardiovascular:     Rate and Rhythm: Normal rate and regular rhythm.     Pulses: Normal pulses.  Pulmonary:     Effort: Pulmonary effort is normal.  Abdominal:     General: Bowel sounds are normal. There is no distension.     Palpations: Abdomen is soft.  Musculoskeletal:        General: No swelling. Normal range of motion.     Cervical back: Normal range of motion.  Skin:    General: Skin is warm and dry.     Capillary Refill: Capillary refill takes less than 2 seconds.  Neurological:     General: No focal deficit present.     Mental  Status: He is alert.     Cranial Nerves: No cranial nerve deficit.     Sensory: No sensory deficit.     Motor: No weakness.     Coordination: Coordination normal.     Gait: Gait normal.     Deep Tendon Reflexes: Reflexes normal.  Psychiatric:        Mood and Affect: Mood normal.        Behavior: Behavior normal.     ED Results / Procedures / Treatments   Labs (all labs ordered are listed, but only abnormal results are displayed) Labs Reviewed  I-STAT CHEM 8, ED - Abnormal; Notable for the following components:      Result Value   Calcium, Ion 1.02 (*)    All other components within normal limits    EKG None  Radiology No results found.  Procedures Procedures    Medications Ordered in ED Medications  levETIRAcetam (KEPPRA) IVPB 1000 mg/100 mL premix (1,000 mg Intravenous New Bag/Given 09/30/21 0818)    ED Course/ Medical Decision Making/ A&P Clinical Course as of 09/30/21 0837  Thu Sep 30, 2021  0834 I-STAT reviewed interpreted and within normal limits  the exception of slightly decreased ionized calcium [DR]    Clinical Course User Index [DR] Margarita Grizzle, MD                           Medical Decision Making 36 year old male who presents today with report of seizure with known history of seizure disorder Differential diagnosis is most consistent with seizure however other etiologies cannot be eliminated initially.  CBG was obtained and this was normal. Additionally patient had normal vital signs likely ruling out other tonic-clonic etiologies Patient has no focal abnormalities and unlikely to have structural changes including mass or bleeding that have initiated this.  He has no headache.  He is back to his baseline Etiologies contributing to having seizure would include other physiological stressors, withdrawal from alcohol or other medications, and, most commonly, noncompliance with medications. Patient reports he has been compliant at all times with his medications.  Last Keppra dose was last night.  8:34 AM Patient rechecked and has had first gram of Keppra and second gram is pending.  He remains awake and alert.  I discussed plan with patient and he is in agreement. IV Keppra 2 g ordered I-STAT 8 ordered to evaluate other metabolic etiologies to contribute to seizures   Amount and/or Complexity of Data Reviewed Independent Historian: EMS    Details: Nursing notes reviewed for EMS report Labs: ordered. Decision-making details documented in ED Course. Discussion of management or test interpretation with external provider(s): Care discussed with Dr. Thomasena Edis on-call for neuro hospitalist Plan to increase Keppra to 2 g twice daily.  Patient does have a current neurologist who is in the pathologist.  Patient will be advised to call Dr Oswaldo Done today for further recommendation We will continue seizure precautions   Risk Prescription drug management.           Final Clinical Impression(s) / ED Diagnoses Final diagnoses:   Seizure [facility])    Rx / DC Orders ED Discharge Orders          Ordered    levETIRAcetam (KEPPRA) 1000 MG tablet        09/30/21 1017              Margarita Grizzle, MD 09/30/21 [phone]

## 2021-09-30 NOTE — Telephone Encounter (Signed)
Pt called in stating he had another "episode" this morning and went to the ED. They increased his Keppra.

## 2021-10-01 NOTE — Telephone Encounter (Signed)
Spoke with pt mother and advised her that Dr Karel Jarvis reviewed the ER notes. Continue Keppra 1000mg : Take 2 tablets twice a day. If any changes prior to his next visit next month, call our office. Pt mother stated that ER called in refill

## 2021-10-01 NOTE — Telephone Encounter (Signed)
Pls let him know I reviewed the ER notes. Continue Keppra 1000mg : Take 2 tablets twice a day. If any changes prior to his next visit next month, call our office. Pls see if he needs refills for this dose. Thanks

## 2021-10-13 ENCOUNTER — Telehealth: Payer: Self-pay | Admitting: Neurology

## 2021-10-13 NOTE — Telephone Encounter (Signed)
Pt called in and left an access nurse stating he needs to get a seizure watch and will need a prescription. He is trying to get on disability and believes this device will assist his case. He would like the [employer] Em[employer] 2. Last seizure was 09/30/21 but has had to increase his Keppra. Full access nurse report is in Dr. Rosalyn Gess box

## 2021-10-13 NOTE — Telephone Encounter (Signed)
Rx done for him to upload to his [employer] account. thanks

## 2021-10-13 NOTE — Telephone Encounter (Signed)
Pt called no answer left a voice mail per DPR that prescription for seizure watch was placed in the mail

## 2021-10-29 ENCOUNTER — Telehealth: Payer: Self-pay | Admitting: Neurology

## 2021-10-29 NOTE — Telephone Encounter (Signed)
Pt advised that he needs to send in the prescription to the company and then they could give him the procedure codes

## 2021-10-29 NOTE — Telephone Encounter (Signed)
[employer] called with this patient to obtain a procedure code.  Clinical was not available so she asked if someone would call the patient back directly.

## 2021-11-12 ENCOUNTER — Encounter: Payer: Self-pay | Admitting: Neurology

## 2021-11-12 ENCOUNTER — Ambulatory Visit: Payer: BC Managed Care – PPO | Admitting: Neurology

## 2021-11-12 VITALS — BP 108/71 | HR 81 | Ht 73.0 in | Wt 197.6 lb

## 2021-11-12 DIAGNOSIS — G40009 Localization-related (focal) (partial) idiopathic epilepsy and epileptic syndromes with seizures of localized onset, not intractable, without status epilepticus: Secondary | ICD-10-CM | POA: Diagnosis not present

## 2021-11-12 MED ORDER — LEVETIRACETAM 1000 MG PO TABS: ORAL_TABLET | ORAL | 3 refills | Status: DC

## 2021-11-12 NOTE — Progress Notes (Signed)
NEUROLOGY FOLLOW UP OFFICE NOTE  Kevin Roth [id number] 10-20-1985  HISTORY OF PRESENT ILLNESS: I had the pleasure of seeing Kevin Roth in follow-up in the neurology clinic on 11/12/2021.  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 He is again accompanied by his mother who helps supplement the history today.  Records and images were personally reviewed where available.  Since his last visit, he had 2 nocturnal convulsions in one day last 09/30/2021, his mother heard something then came to his room and witnessed another convulsion. Levetiracetam increased to 2000mg  BID. He had contacted our office in May reporting stress at home living with his parents and requested a Behavioral Health referral, however today with his mother he reports that his mood has leveled out and he does not need to see BH. His mother reports he does not get as agitated, but still has his moods (chronic).  No staring/unresponsive episodes. He denies any of his swimmyheaded episodes, no olfactory/gustatory hallucinations, focal numbness/tingling/weakness, myoclonic jerks. No headaches, dizziness, vision changes, no falls. He gets adequate sleep, his mother reports he gets 8 hours at home but does not know when he is with his girlfriend. He spends more time with his girlfriend.   History on Initial Assessment 04/14/2021: This is a 36 year old left-handed man with a history of one episode of status epilepticus at age 36 requiring intubation and treated by Dr. 10 with Dilantin for 2 years, off medication since age 36 and seizure-free for 29 years until 04/06/21. He brings a bottle of apple butter and states that his mother made him apple butter toast that morning then he immediately got tired and lay back in bed. He fell asleep and woke up with his tongue sore. His body was not sore, no incontinence. Later that afternoon, they went on errands for last minute Christmas shopping, his last memory was  being at 11-09-1973. He does not recall that they went to [facility] after, he woke up in the ambulance. His mother reports he was fine the whole time they were at [facility], they got in the car for home and he started saying he had not felt like this in 15 years, like he was "starting to float." He was in the passenger seat and his mother noticed his speech was slurred then his head turned to the right and he had a generalized convulsion. He bit the right side of his tongue with profuse bleeding. He felt normal when he woke up in the ambulance, unaware of events, no focal weakness, incontinence. He felt sore later on. He was brought to the ER where CBC showed a WBC of 14. CMP normal, UDS positive for THC. I personally reviewed head CT without contrast which was unremarkable. He was discharged home on Levetiracetam 500mg  BID.   They deny any further seizures since 04/06/21. Over the past 15 years, he has noticed intermittent episodes of deja vu with motion sickness. He denies any episodes of staring/unresponsiveness, gaps in time, olfactory/gustatory hallucinations, rising epigastric sensation, focal numbness/tingling/weakness, myoclonic jerks. He has had headaches since starting the Levetiracetam. No dizziness, diplopia, dysarthria/dysphagia, neck/back pain, bowel/bladder dysfunction. His mother reports significant mood side effects on the Levetiracetam, he is "so ill and hateful." He states prior to LEV, mood was for the most part okay, his mother reports he is temperamental but not like it is now. Now he states his mood is lousy. He gets 8 hours of sleep, denies any sleep deprivation or  alcohol intake prior to the seizure. He lives with his parents and is currently not working.   Epilepsy Risk Factors:  He had a normal early development.  There is no history of febrile convulsions, CNS infections such as meningitis/encephalitis, significant traumatic brain injury, neurosurgical procedures, or family history of  seizures.  Prior AEDs: Dilantin  Diagnostic Data: MRI brain with and without contrast done 05/2021 no acute changes, hippocampi symmetric with no abnormal signal or enhancement.  24-hour EEG in 05/2021 captured 2 30-second left temporal seizures during push button events for sensation of motion sickness, swimmyheadedness, little turning of the stomach, no loss of awareness.   PAST MEDICAL HISTORY: Past Medical History:  Diagnosis Date   Seizures (HCC)     MEDICATIONS: Current Outpatient Medications on File Prior to Visit  Medication Sig Dispense Refill   levETIRAcetam (KEPPRA) 1000 MG tablet Take 2 tablets two times daily 270 tablet 3   No current facility-administered medications on file prior to visit.    ALLERGIES: No Known Allergies  FAMILY HISTORY: No family history on file.  SOCIAL HISTORY: Social History   Socioeconomic History   Marital status: Single    Spouse name: Not on file   Number of children: Not on file   Years of education: Not on file   Highest education level: Not on file  Occupational History   Not on file  Tobacco Use   Smoking status: Every Day    Types: Cigars   Smokeless tobacco: Never  Vaping Use   Vaping Use: Never used  Substance and Sexual Activity   Alcohol use: Yes    Comment: Socially   Drug use: No   Sexual activity: Not on file  Other Topics Concern   Not on file  Social History Narrative   Left handed    Social Determinants of Health   Financial Resource Strain: Not on file  Food Insecurity: Not on file  Transportation Needs: Not on file  Physical Activity: Not on file  Stress: Not on file  Social Connections: Not on file  Intimate Partner Violence: Not on file     PHYSICAL EXAM: Vitals:   11/12/21 1436  BP: 108/71  Pulse: 81  SpO2: 98%   General: No acute distress Head:  Normocephalic/atraumatic Skin/Extremities: No rash, no edema Neurological Exam: alert and awake. No aphasia or dysarthria. Fund of  knowledge is appropriate. Attention and concentration are normal.   Cranial nerves: Pupils equal, round. Extraocular movements intact with no nystagmus. Visual fields full.  No facial asymmetry.  Motor: Bulk and tone normal, muscle strength 5/5 throughout with no pronator drift.   Finger to nose testing intact.  Gait narrow-based and steady, able to tandem walk adequately.  Romberg negative.   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 He had an episode of status epilepticus at age 50 and was on Dilantin for 2 years, off medication and seizure-free for 29 years until seizures recurred in December 2022. He has had 3 more convulsions since then, most recently last 09/30/2021. He is now on Levetiracetam 2000mg  BID. If seizures recur with no triggers, we will plan to add on Lamotrigine. He knows to avoid seizure triggers. He is not driving and is aware of [place] driving laws. Follow-up in 3-4 months, call for any changes.   Thank you for allowing me to participate in his care.  Please do not hesitate to call for any questions or concerns.    ,  M.D.   CC: Dr. Azucena Cecil

## 2021-11-12 NOTE — Patient Instructions (Signed)
Good to see you. Continue Levetiracetam 1000mg : take 2 tablets twice a day. Follow-up in 3-4 months, call for any changes.   Seizure Precautions: 1. If medication has been prescribed for you to prevent seizures, take it exactly as directed.  Do not stop taking the medicine without talking to your doctor first, even if you have not had a seizure in a long time.   2. Avoid activities in which a seizure would cause danger to yourself or to others.  Don't operate dangerous machinery, swim alone, or climb in high or dangerous places, such as on ladders, roofs, or girders.  Do not drive unless your doctor says you may.  3. If you have any warning that you may have a seizure, lay down in a safe place where you can't hurt yourself.    4.  No driving for 6 months from last seizure, as per [facility].   Please refer to the following link on the Epilepsy Foundation of [employer]'s website for more information: http://www.epilepsyfoundation.org/answerplace/Social/driving/drivingu.cfm   5.  Maintain good sleep hygiene. Avoid alcohol.  6.  Contact your doctor if you have any problems that may be related to the medicine you are taking.  7.  Call 911 and bring the patient back to the ED if:        A.  The seizure lasts longer than 5 minutes.       B.  The patient doesn't awaken shortly after the seizure  C.  The patient has new problems such as difficulty seeing, speaking or moving  D.  The patient was injured during the seizure  E.  The patient has a temperature over 102 F (39C)  F.  The patient vomited and now is having trouble breathing

## 2021-12-02 ENCOUNTER — Telehealth: Payer: Self-pay | Admitting: Neurology

## 2021-12-02 NOTE — Telephone Encounter (Signed)
Patient states that he went to get his EBT card and was told that we would need to do a letter stating why he would need the EBT card. He has no funds and states this needs to be done as possible please.    We need to fax the letter to Everrett Coombe at [phone]  she is at the EBT place

## 2021-12-02 NOTE — Telephone Encounter (Signed)
Called patient and he said he needs a letter from [place] stating that he has this condition and that he can give to his case worker. I asked patient if he had tried his PCP and he said no because he needs letter to state his neurological condition. I told him that Dr. Karel Jarvis is out of the office until Tuesday

## 2021-12-15 NOTE — Telephone Encounter (Signed)
Pt is calling back on this please call patient

## 2021-12-15 NOTE — Telephone Encounter (Signed)
Patient called and left a new fax number: [phone], for Zella Ball, his case worker.

## 2021-12-17 ENCOUNTER — Encounter: Payer: Self-pay | Admitting: Neurology

## 2021-12-17 NOTE — Telephone Encounter (Signed)
Pls fax and let patient know letter is done, thanks

## 2021-12-17 NOTE — Telephone Encounter (Signed)
Faxed and completed

## 2022-01-28 ENCOUNTER — Ambulatory Visit: Payer: BC Managed Care – PPO | Admitting: Neurology

## 2022-01-28 ENCOUNTER — Encounter: Payer: Self-pay | Admitting: Neurology

## 2022-01-28 VITALS — BP 135/86 | HR 75 | Ht 73.0 in | Wt 192.0 lb

## 2022-01-28 DIAGNOSIS — G40009 Localization-related (focal) (partial) idiopathic epilepsy and epileptic syndromes with seizures of localized onset, not intractable, without status epilepticus: Secondary | ICD-10-CM | POA: Diagnosis not present

## 2022-01-28 MED ORDER — LEVETIRACETAM 1000 MG PO TABS: ORAL_TABLET | ORAL | 3 refills | Status: DC

## 2022-01-28 NOTE — Patient Instructions (Signed)
Good to see you.  Continue Levetiracetam (Keppra) 1000mg : take 2 tablets twice a day  2. Let me know if you need refills for [place]  3. Follow-up in 4 months, call for any changes   Seizure Precautions: 1. If medication has been prescribed for you to prevent seizures, take it exactly as directed.  Do not stop taking the medicine without talking to your doctor first, even if you have not had a seizure in a long time.   2. Avoid activities in which a seizure would cause danger to yourself or to others.  Don't operate dangerous machinery, swim alone, or climb in high or dangerous places, such as on ladders, roofs, or girders.  Do not drive unless your doctor says you may.  3. If you have any warning that you may have a seizure, lay down in a safe place where you can't hurt yourself.    4.  No driving for 6 months from last seizure, as per [facility]   Please refer to the following link on the [place] website for more information: http://www.epilepsyfoundation.org/answerplace/Social/driving/drivingu.cfm   5.  Maintain good sleep hygiene. Avoid alcohol.  6.  Contact your doctor if you have any problems that may be related to the medicine you are taking.  7.  Call 911 and bring the patient back to the ED if:        A.  The seizure lasts longer than 5 minutes.       B.  The patient doesn't awaken shortly after the seizure  C.  The patient has new problems such as difficulty seeing, speaking or moving  D.  The patient was injured during the seizure  E.  The patient has a temperature over 102 F (39C)  F.  The patient vomited and now is having trouble breathing

## 2022-01-28 NOTE — Progress Notes (Signed)
NEUROLOGY FOLLOW UP OFFICE NOTE  Kevin Roth [id number] [birth year]  HISTORY OF PRESENT ILLNESS: I had the pleasure of seeing Kevin Roth in follow-up in the neurology clinic on 01/28/2022.  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 He is again accompanied by his mother and his Kevin Roth who help supplement the history today.  Records and images were personally reviewed where available. Since his last visit, they deny any convulsions since 09/30/2021. He is on Levetiracetam 200mg  BID. He denies any episodes of feeling swimmyheaded, staring/unresponsive episodes, gaps in time, olfactory/gustatory hallucinations, focal numbness/tingling/weakness, myoclonic jerks. No headaches, dizziness, vision changes, no falls. Sleep is good. Mood is leveled out, they notice he is more moody when he smokes. He is concerned lights from the computer triggered his seizure the next morning one time, he has been avoiding the computer since then. He has prn Nayzilam but has not needed it.    History on Initial Assessment 04/14/2021: This is a 36 year old left-handed man with a history of one episode of status epilepticus at age 36 requiring intubation and treated by Dr. 10 with Dilantin for 2 years, off medication since age 36 and seizure-free for 38 years until 04/06/21. He brings a bottle of apple butter and states that his mother made him apple butter toast that morning then he immediately got tired and lay back in bed. He fell asleep and woke up with his tongue sore. His body was not sore, no incontinence. Later that afternoon, they went on errands for last minute Christmas shopping, his last memory was being at 11-09-1973. He does not recall that they went to [facility] after, he woke up in the ambulance. His mother reports he was fine the whole time they were at [facility], they got in the car for home and he started saying he had not felt like this in 15 years, like he was "starting to  float." He was in the passenger seat and his mother noticed his speech was slurred then his head turned to the right and he had a generalized convulsion. He bit the right side of his tongue with profuse bleeding. He felt normal when he woke up in the ambulance, unaware of events, no focal weakness, incontinence. He felt sore later on. He was brought to the ER where CBC showed a WBC of 14. CMP normal, UDS positive for THC. I personally reviewed head CT without contrast which was unremarkable. He was discharged home on Levetiracetam 500mg  BID.   They deny any further seizures since 04/06/21. Over the past 15 years, he has noticed intermittent episodes of deja vu with motion sickness. He denies any episodes of staring/unresponsiveness, gaps in time, olfactory/gustatory hallucinations, rising epigastric sensation, focal numbness/tingling/weakness, myoclonic jerks. He has had headaches since starting the Levetiracetam. No dizziness, diplopia, dysarthria/dysphagia, neck/back pain, bowel/bladder dysfunction. His mother reports significant mood side effects on the Levetiracetam, he is "so ill and hateful." He states prior to LEV, mood was for the most part okay, his mother reports he is temperamental but not like it is now. Now he states his mood is lousy. He gets 8 hours of sleep, denies any sleep deprivation or alcohol intake prior to the seizure. He lives with his parents and is currently not working.   Epilepsy Risk Factors:  He had a normal early development.  There is no history of febrile convulsions, CNS infections such as meningitis/encephalitis, significant traumatic brain injury, neurosurgical procedures, or family history of  seizures.  Prior AEDs: Dilantin  Diagnostic Data: MRI brain with and without contrast done 05/2021 no acute changes, hippocampi symmetric with no abnormal signal or enhancement.  24-hour EEG in 05/2021 captured 2 30-second left temporal seizures during push button events for  sensation of motion sickness, swimmyheadedness, little turning of the stomach, no loss of awareness.    PAST MEDICAL HISTORY: Past Medical History:  Diagnosis Date   Seizures ([place])     MEDICATIONS: Current Outpatient Medications on File Prior to Visit  Medication Sig Dispense Refill   levETIRAcetam (KEPPRA) 1000 MG tablet Take 2 tablets two times daily 360 tablet 3   No current facility-administered medications on file prior to visit.    ALLERGIES: No Known Allergies  FAMILY HISTORY: History reviewed. No pertinent family history.  SOCIAL HISTORY: Social History   Socioeconomic History   Marital status: Single    Spouse name: Not on file   Number of children: Not on file   Years of education: Not on file   Highest education level: Not on file  Occupational History   Not on file  Tobacco Use   Smoking status: Every Day    Types: Cigars   Smokeless tobacco: Never  Vaping Use   Vaping Use: Never used  Substance and Sexual Activity   Alcohol use: Yes    Comment: Socially   Drug use: No   Sexual activity: Not on file  Other Topics Concern   Not on file  Social History Narrative   Left handed    Social Determinants of Health   Financial Resource Strain: Not on file  Food Insecurity: Not on file  Transportation Needs: Not on file  Physical Activity: Not on file  Stress: Not on file  Social Connections: Not on file  Intimate Partner Violence: Not on file     PHYSICAL EXAM: Vitals:   01/28/22 1606  BP: 135/86  Pulse: 75  SpO2: 97%   General: No acute distress Head:  Normocephalic/atraumatic Skin/Extremities: No rash, no edema Neurological Exam: alert and awake. No aphasia or dysarthria. Fund of knowledge is appropriate.  Attention and concentration are normal.   Cranial nerves: Pupils equal, round. Extraocular movements intact with no nystagmus. Visual fields full.  No facial asymmetry.  Motor: Bulk and tone normal, muscle strength 5/5 throughout with no  pronator drift.   Finger to nose testing intact.  Gait narrow-based and steady, able to tandem walk adequately.  Romberg negative.   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 He had an episode of status epilepticus at age 29 and was on Dilantin for 2 years, off medication and seizure-free for 38 years until seizures recurred in December 2022. He denies any convulsions since 09/30/2021 on Levetiracetam 2000mg  BID, refills sent. He has prn Nayzilam for seizure rescue. We discussed avoidance of seizure triggers. If seizures recur with no triggers, we will plan to add on Lamotrigine. He is aware of [place] driving laws to stop driving after a seizure until 6 months seizure-free. Follow-up in 4 months, call for any changes.   Thank you for allowing me to participate in his care.  Please do not hesitate to call for any questions or concerns.    Ellouise Newer, M.D.   CC: Dr. Moreen Fowler

## 2022-02-11 DIAGNOSIS — Z0000 Encounter for general adult medical examination without abnormal findings: Secondary | ICD-10-CM | POA: Diagnosis not present

## 2022-02-11 DIAGNOSIS — E7800 Pure hypercholesterolemia, unspecified: Secondary | ICD-10-CM | POA: Diagnosis not present

## 2022-02-15 ENCOUNTER — Ambulatory Visit: Payer: BC Managed Care – PPO | Admitting: Neurology

## 2022-05-01 IMAGING — MR MR HEAD WO/W CM
15 series · 48 of 48 positions shown · IV contrast (multihance)
Comparison: 09/09/2005.

CLINICAL DATA: Seizure

EXAM:
MRI HEAD WITHOUT AND WITH CONTRAST
TECHNIQUE: Multiplanar, multiecho pulse sequences of the brain and surrounding
structures were obtained without and with intravenous contrast.
CONTRAST:  18mL MULTIHANCE GADOBENATE DIMEGLUMINE 529 MG/ML IV SOLN

[Series 2: T1 · sagittal · 5.0mm · 0.45mm/px · 1 of 25 slices shown]
[im 1/25]
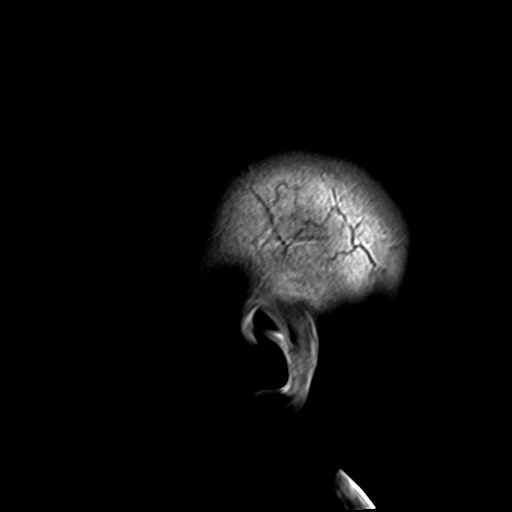

[Series 3: ax ep2d_diff_3 · axial · 3.0mm · 1.80mm/px · z∈[-68,+91]mm · 6 of 109 slices shown]
[im 1/109]
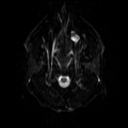
[im 22/109]
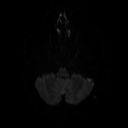
[im 44/109]
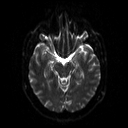
[im 65/109]
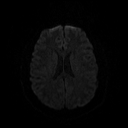
[im 87/109]
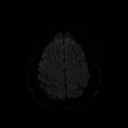
[im 109/109]
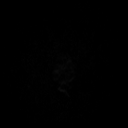

[Series 4: ax ep2d_diff_3_adc · axial · 3.0mm · 1.80mm/px · z∈[-68,+91]mm · 3 of 55 slices shown]
[im 1/55]
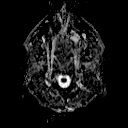
[im 28/55]
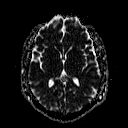
[im 55/55]
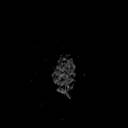

[Series 5: cor ep2d_diff · coronal · 5.0mm · 1.77mm/px · 3 of 60 slices shown]
[im 1/60]
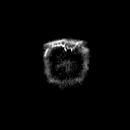
[im 30/60]
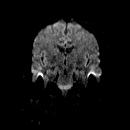
[im 60/60]
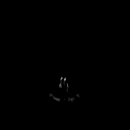

[Series 6: cor ep2d_diff_adc · coronal · 5.0mm · 1.77mm/px · 2 of 30 slices shown]
[im 1/30]
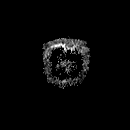
[im 30/30]
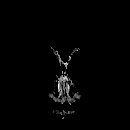

[Series 8: swi_images · axial · 2.0mm · 0.98mm/px · z∈[-65,+90]mm · 4 of 80 slices shown]
[im 1/80]
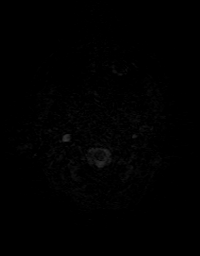
[im 27/80]
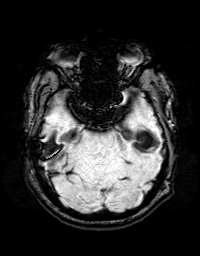
[im 53/80]
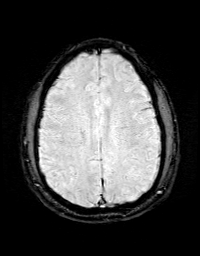
[im 80/80]
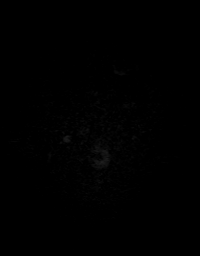

[Series 9: FLAIR · axial · 3.0mm · 0.43mm/px · z∈[-65,+85]mm · 2 of 40 slices shown (1 of 2)]
[im 1/40]
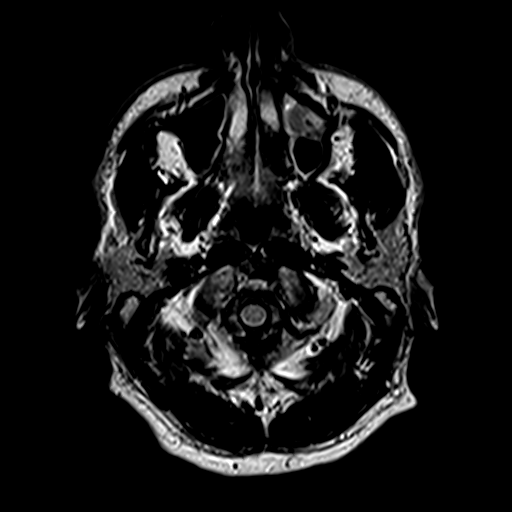
[im 40/40]
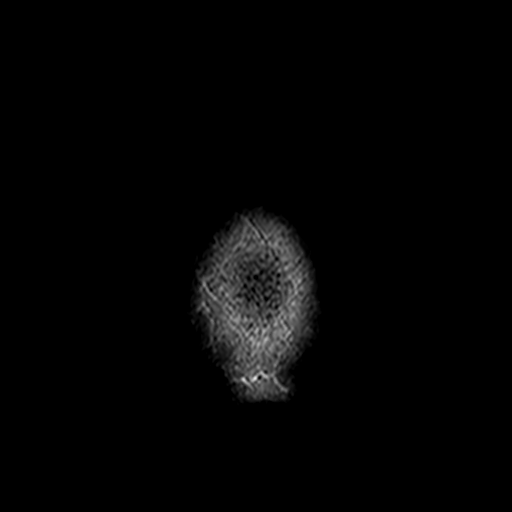

[Series 10: T2 · axial · 5.0mm · 0.65mm/px · z∈[-70,+95]mm · 2 of 29 slices shown (1 of 3)]
[im 1/29]
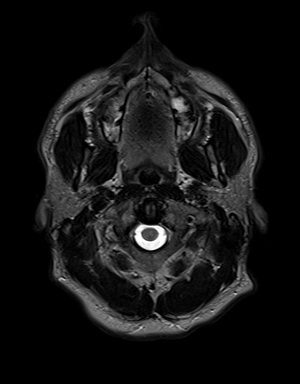
[im 29/29]
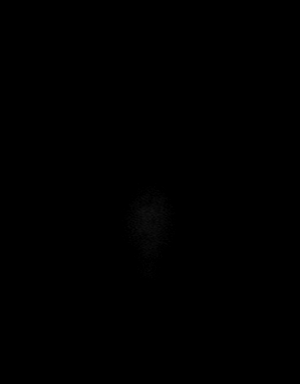

[Series 11: t1_mpr_tra · axial · 1.0mm · 0.72mm/px · z∈[-62,+94]mm · 9 of 160 slices shown]
[im 1/160]
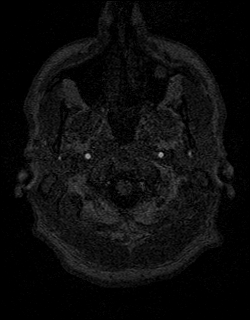
[im 20/160]
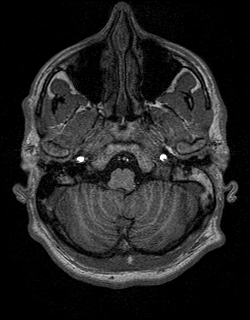
[im 40/160]
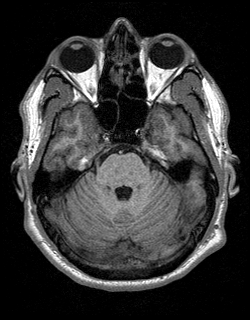
[im 60/160]
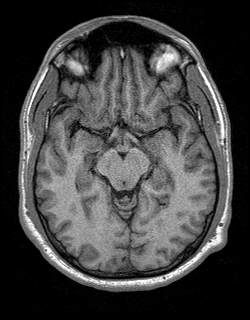
[im 80/160]
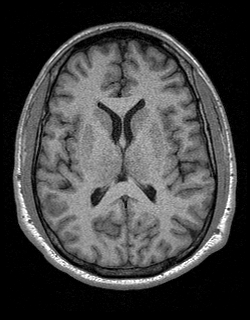
[im 100/160]
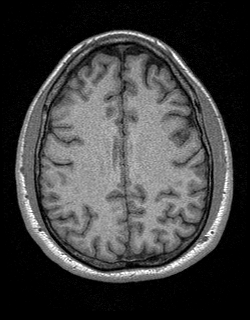
[im 120/160]
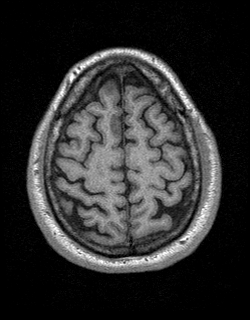
[im 140/160]
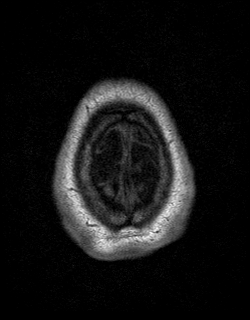
[im 160/160]
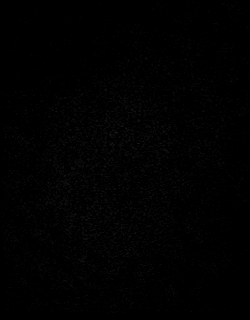

[Series 12: T2 · coronal · 3.0mm · 0.56mm/px · 1 of 23 slices shown (2 of 3)]
[im 1/23]
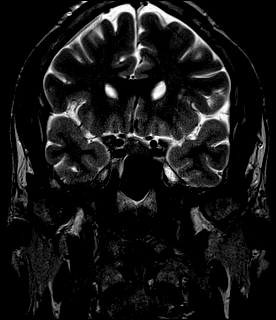

[Series 13: reformat cor · coronal · 1.0mm · 0.72mm/px · 2 of 40 slices shown]
[im 1/40]
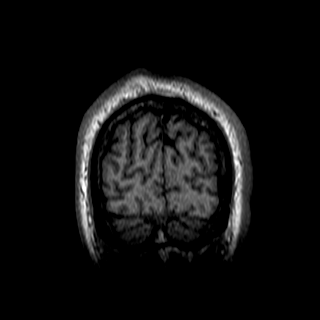
[im 40/40]
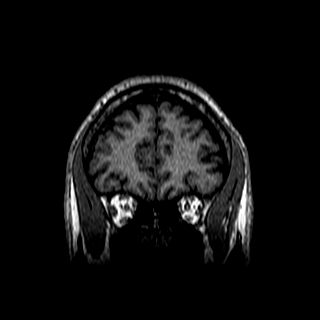

[Series 14: FLAIR · coronal · 3.0mm · 0.35mm/px · 1 of 25 slices shown (2 of 2)]
[im 1/25]
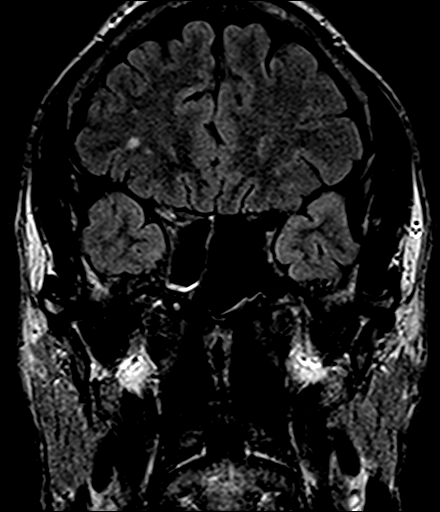

[Series 15: T2 · coronal · 5.0mm · 0.43mm/px · 2 of 28 slices shown (3 of 3)]
[im 1/28]
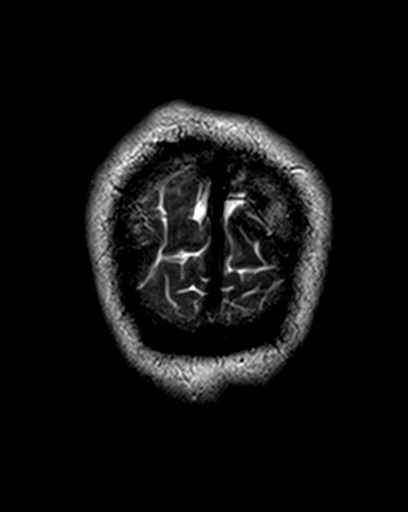
[im 28/28]
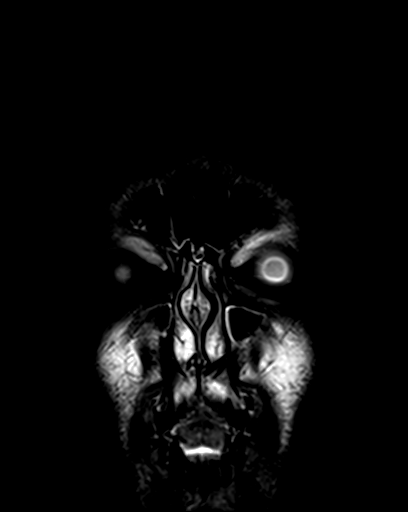

[Series 16: post t1_mpr_tra · axial · 1.0mm · 0.72mm/px · z∈[-67,+89]mm · 9 of 160 slices shown]
[im 1/160]
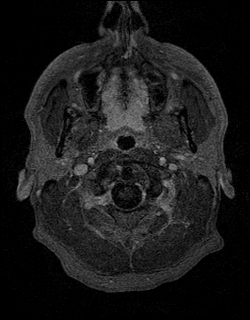
[im 20/160]
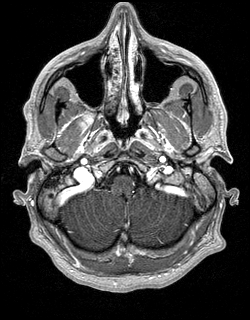
[im 40/160]
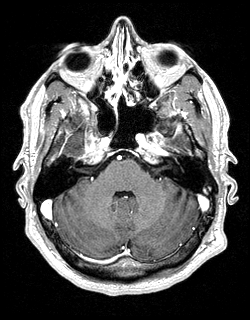
[im 60/160]
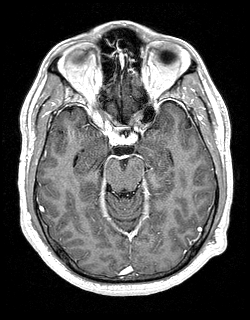
[im 80/160]
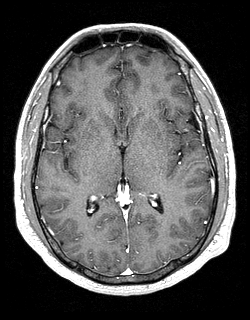
[im 100/160]
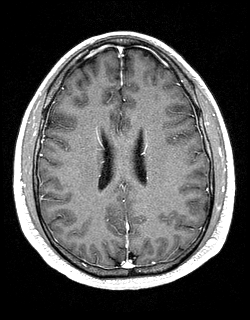
[im 120/160]
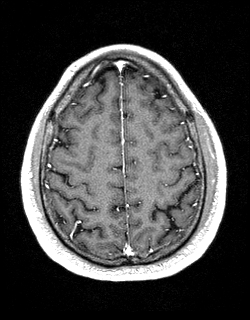
[im 140/160]
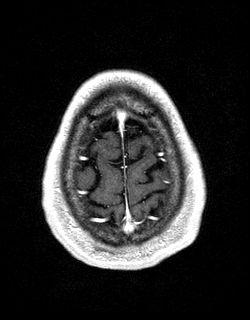
[im 160/160]
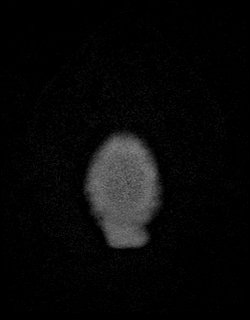

[Series 17: T1 post-contrast · coronal · 5.0mm · 0.72mm/px · 1 of 26 slices shown]
[im 1/26]
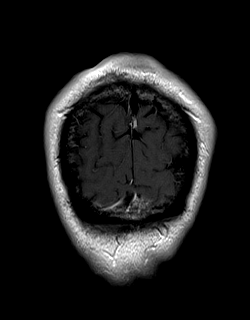

[48 of 48 positions shown; findings below may reference images not displayed]

FINDINGS: Brain: Restricted diffusion to suggest acute or subacute infarct. No
acute hemorrhage, mass, mass effect, or midline shift. Scattered T2
hyperintense foci in the bilateral frontal lobes (series 9, image
21, for example), which are unchanged from the 5002 exam. No
hemosiderin deposition to suggest remote hemorrhage. No
hydrocephalus or extra-axial collection.

The hippocampi are symmetric in size and normal in signal. No
heterotopia or evidence of cortical dysplasia. No abnormal
parenchymal or meningeal enhancement.

Vascular: Normal flow voids.

Skull and upper cervical spine: Normal marrow signal.

Sinuses/Orbits: Mucosal thickening in the left maxillary sinus and
ethmoid air cells. The orbits are unremarkable.

Other: Fluid in the right mastoid air cells.
IMPRESSION: No acute intracranial process.  No seizure etiology identified.

## 2022-06-13 ENCOUNTER — Ambulatory Visit: Payer: BC Managed Care – PPO | Admitting: Neurology

## 2022-06-13 ENCOUNTER — Encounter: Payer: Self-pay | Admitting: Neurology

## 2022-06-13 VITALS — BP 122/76 | HR 56 | Ht 73.0 in | Wt 192.2 lb

## 2022-06-13 DIAGNOSIS — G40009 Localization-related (focal) (partial) idiopathic epilepsy and epileptic syndromes with seizures of localized onset, not intractable, without status epilepticus: Secondary | ICD-10-CM | POA: Diagnosis not present

## 2022-06-13 MED ORDER — LEVETIRACETAM 1000 MG PO TABS: ORAL_TABLET | ORAL | 3 refills | Status: DC

## 2022-06-13 NOTE — Progress Notes (Signed)
NEUROLOGY FOLLOW UP OFFICE NOTE  Kevin Roth [id number] [birth year]  HISTORY OF PRESENT ILLNESS: I had the pleasure of seeing Kevin Roth in follow-up in the neurology clinic on 06/13/2022.  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 He is again accompanied by his mother who helps supplement the history today.  Records and images were personally reviewed where available. He continues to do well seizure-free since 09/30/2021. He denies any gaps in time, swimmyheaded feelings, epigastric sensation, olfactory/gustatory hallucinations, focal numbness/tingling/weakness, myoclonic jerks. His mother states she does not see him much, he lives with his Kevin Roth, no report of staring/unresponsiveness. .No headaches, dizziness, vision changes, no falls. His mother reports they sleep "a whole lot." He reports an average of 8-10 hours of sleep. Mood is definitely improved since he first started Levetiracetam. He is on '2000mg'$  BID without side effects. He does note it helps him sleep, but also that it feels like it takes him twice or three times longer to get things accomplished. He has prn Nayzilam and has not needed it.   History on Initial Assessment 04/14/2021: This is a 37 year old left-handed man with a history of one episode of status epilepticus at age 12 requiring intubation and treated by Dr. Gaynell Roth with Dilantin for 2 years, off medication since age 89 and seizure-free for 29 years until 04/06/21. He brings a bottle of apple butter and states that his mother made him apple butter toast that morning then he immediately got tired and lay back in bed. He fell asleep and woke up with his tongue sore. His body was not sore, no incontinence. Later that afternoon, they went on errands for last minute Christmas shopping, his last memory was being at [employer]. He does not recall that they went to [facility] after, he woke up in the ambulance. His mother reports he was fine the whole  time they were at [facility], they got in the car for home and he started saying he had not felt like this in 15 years, like he was "starting to float." He was in the passenger seat and his mother noticed his speech was slurred then his head turned to the right and he had a generalized convulsion. He bit the right side of his tongue with profuse bleeding. He felt normal when he woke up in the ambulance, unaware of events, no focal weakness, incontinence. He felt sore later on. He was brought to the ER where CBC showed a WBC of 14. CMP normal, UDS positive for THC. I personally reviewed head CT without contrast which was unremarkable. He was discharged home on Levetiracetam '500mg'$  BID.   They deny any further seizures since 04/06/21. Over the past 15 years, he has noticed intermittent episodes of deja vu with motion sickness. He denies any episodes of staring/unresponsiveness, gaps in time, olfactory/gustatory hallucinations, rising epigastric sensation, focal numbness/tingling/weakness, myoclonic jerks. He has had headaches since starting the Levetiracetam. No dizziness, diplopia, dysarthria/dysphagia, neck/back pain, bowel/bladder dysfunction. His mother reports significant mood side effects on the Levetiracetam, he is "so ill and hateful." He states prior to LEV, mood was for the most part okay, his mother reports he is temperamental but not like it is now. Now he states his mood is lousy. He gets 8 hours of sleep, denies any sleep deprivation or alcohol intake prior to the seizure. He lives with his parents and is currently not working.   Epilepsy Risk Factors:  He had a normal early  development.  There is no history of febrile convulsions, CNS infections such as meningitis/encephalitis, significant traumatic brain injury, neurosurgical procedures, or family history of seizures.  Prior AEDs: Dilantin  Diagnostic Data: MRI brain with and without contrast done 05/2021 no acute changes, hippocampi symmetric with  no abnormal signal or enhancement.  24-hour EEG in 05/2021 captured 2 30-second left temporal seizures during push button events for sensation of motion sickness, swimmyheadedness, little turning of the stomach, no loss of awareness.    PAST MEDICAL HISTORY: Past Medical History:  Diagnosis Date   Seizures ([place])     MEDICATIONS: Current Outpatient Medications on File Prior to Visit  Medication Sig Dispense Refill   levETIRAcetam (KEPPRA) 1000 MG tablet Take 2 tablets two times daily 360 tablet 3   No current facility-administered medications on file prior to visit.    ALLERGIES: No Known Allergies  FAMILY HISTORY: History reviewed. No pertinent family history.  SOCIAL HISTORY: Social History   Socioeconomic History   Marital status: Single    Spouse name: Not on file   Number of children: Not on file   Years of education: Not on file   Highest education level: Not on file  Occupational History   Not on file  Tobacco Use   Smoking status: Every Day    Types: Cigars   Smokeless tobacco: Never  Vaping Use   Vaping Use: Never used  Substance and Sexual Activity   Alcohol use: Not Currently    Comment: Socially   Drug use: No   Sexual activity: Not on file  Other Topics Concern   Not on file  Social History Narrative   Left handed    Social Determinants of Health   Financial Resource Strain: Not on file  Food Insecurity: Not on file  Transportation Needs: Not on file  Physical Activity: Not on file  Stress: Not on file  Social Connections: Not on file  Intimate Partner Violence: Not on file     PHYSICAL EXAM: Vitals:   06/13/22 1528  BP: 122/76  Pulse: (!) 56  SpO2: 98%   General: No acute distress Head:  Normocephalic/atraumatic Skin/Extremities: No rash, no edema Neurological Exam: alert and awake. No aphasia or dysarthria. Fund of knowledge is appropriate. Attention and concentration are normal.   Cranial nerves: Pupils equal, round. Extraocular  movements intact with no nystagmus. Visual fields full.  No facial asymmetry.  Motor: Bulk and tone normal, muscle strength 5/5 throughout with no pronator drift.   Finger to nose testing intact.  Gait slow and cautious reporting bilateral knee issues. No ataxia.    IMPRESSION: This is a 37 yo LH man with left temporal lobe epilepsy. He had an episode of status epilepticus at age 54 and was on Dilantin for 2 years, off medication and seizure-free for 29 years until seizures recurred in December 2022. No convulsions since 09/30/21, continue Levetiracetam '2000mg'$  BID. He has prn Nayzilam for seizure rescue. He is aware of [place] driving laws to stop driving until 6 months seizure-free. Follow-up in 6 months, call for any changes.   Thank you for allowing me to participate in his care.  Please do not hesitate to call for any questions or concerns.   Ellouise Newer, M.D.   CC: Dr. Moreen Fowler

## 2022-06-13 NOTE — Patient Instructions (Signed)
Good to see you doing well. Continue Levetiracetam (Keppra) '1000mg'$ : take 2 tablets twice a day. Follow-up in 6 months, call for any changes.   Seizure Precautions: 1. If medication has been prescribed for you to prevent seizures, take it exactly as directed.  Do not stop taking the medicine without talking to your doctor first, even if you have not had a seizure in a long time.   2. Avoid activities in which a seizure would cause danger to yourself or to others.  Don't operate dangerous machinery, swim alone, or climb in high or dangerous places, such as on ladders, roofs, or girders.  Do not drive unless your doctor says you may.  3. If you have any warning that you may have a seizure, lay down in a safe place where you can't hurt yourself.    4.  No driving for 6 months from last seizure, as per [facility].   Please refer to the following link on the [place] website for more information: http://www.epilepsyfoundation.org/answerplace/Social/driving/drivingu.cfm   5.  Maintain good sleep hygiene. Avoid alcohol.  6.  Contact your doctor if you have any problems that may be related to the medicine you are taking.  7.  Call 911 and bring the patient back to the ED if:        A.  The seizure lasts longer than 5 minutes.       B.  The patient doesn't awaken shortly after the seizure  C.  The patient has new problems such as difficulty seeing, speaking or moving  D.  The patient was injured during the seizure  E.  The patient has a temperature over 102 F (39C)  F.  The patient vomited and now is having trouble breathing

## 2022-07-25 ENCOUNTER — Telehealth: Payer: Self-pay | Admitting: Neurology

## 2022-07-25 DIAGNOSIS — F32A Depression, unspecified: Secondary | ICD-10-CM

## 2022-07-25 NOTE — Telephone Encounter (Signed)
I don't think it's all due to Keppra, he may have underlying depression also. I would be hesitant to reduce Keppra since he has had no seizures, but we can start low dose Lamictal 25mg  BID which is also a seizure medication that helps with mood stabilization, then after 2 weeks of taking lamictal, he can reduce Keppra to 1500mg  BID. Side effects of Lamictal are usually at higher doses, can include dizziness, blurred vision, rash.   The other option is referral to a psychiatrist for these symptoms suggestive of depression. Thanks

## 2022-07-25 NOTE — Telephone Encounter (Signed)
Pt's mother called in stating the pt was having side effects from the medications the pt is having. They are trying to get disability for him and would like to speak with someone about the side effects.

## 2022-07-25 NOTE — Telephone Encounter (Signed)
Mom called, they are trying to get disability. He is taken Keppra 2000mg  bid Irritable, anxiety, weight loss, sleeping a lot Tired, no seizures. Please advise

## 2022-07-26 NOTE — Telephone Encounter (Signed)
Pt's mother called back in wanting to speak with Neysa Bonito again about a therapist for the patient.

## 2022-07-26 NOTE — Telephone Encounter (Signed)
Patient doesn't want to change medication at this time, wants noted in chart that he is having memory issues, short term and long memory from side of effects of keppra and fatigued and sleep all the time. FYI, lost weight too. They are trying to get disability

## 2022-07-26 NOTE — Telephone Encounter (Signed)
Can we close this, it looks like referral has been sent to Behavioral Health? Is there anything else we need to do, for disability, SS would be getting medical records and they review.

## 2022-09-15 ENCOUNTER — Ambulatory Visit (HOSPITAL_COMMUNITY): Payer: Self-pay | Admitting: Student in an Organized Health Care Education/Training Program
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
## 2022-10-27 ENCOUNTER — Ambulatory Visit (HOSPITAL_BASED_OUTPATIENT_CLINIC_OR_DEPARTMENT_OTHER): Payer: BC Managed Care – PPO | Admitting: Psychiatry

## 2022-10-27 ENCOUNTER — Encounter (HOSPITAL_COMMUNITY): Payer: Self-pay | Admitting: Psychiatry

## 2022-10-27 DIAGNOSIS — F411 Generalized anxiety disorder: Secondary | ICD-10-CM | POA: Diagnosis not present

## 2022-10-27 DIAGNOSIS — G40009 Localization-related (focal) (partial) idiopathic epilepsy and epileptic syndromes with seizures of localized onset, not intractable, without status epilepticus: Secondary | ICD-10-CM

## 2022-11-03 ENCOUNTER — Ambulatory Visit (HOSPITAL_COMMUNITY): Payer: Self-pay | Admitting: Psychiatry

## 2022-12-12 ENCOUNTER — Ambulatory Visit: Payer: BC Managed Care – PPO | Admitting: Neurology

## 2022-12-12 ENCOUNTER — Encounter: Payer: Self-pay | Admitting: Neurology

## 2022-12-12 VITALS — BP 118/72 | HR 74 | Ht 73.0 in | Wt 186.8 lb

## 2022-12-12 DIAGNOSIS — G40009 Localization-related (focal) (partial) idiopathic epilepsy and epileptic syndromes with seizures of localized onset, not intractable, without status epilepticus: Secondary | ICD-10-CM | POA: Diagnosis not present

## 2022-12-12 NOTE — Progress Notes (Signed)
NEUROLOGY FOLLOW UP OFFICE NOTE  Kevin Roth [id number] 01/27/1986  HISTORY OF PRESENT ILLNESS: I had the pleasure of seeing Kevin Roth in follow-up in the neurology clinic on 12/12/2022.  The patient was last seen 6 months ago for left temporal lobe epilepsy. He is again accompanied by his mother who helps supplement the history today.  Records and images were personally reviewed where available.  Since his last visit, they continue to deny any seizures since 09/30/2021. No staring/unresponsive episodes, gaps in time, swimmyheaded feelings, epigastric sensation, focal numbness/tingling/weakness, myoclonic jerks. No headaches, dizziness, vision changes, no falls. He is on Levetiracetam 2000mg  BID and feels that his body has gotten acclimated to the medication. His mother reports that he is tired all the time, and irritable on a daily basis. He and his fiancee are back living at home with his parents, they do admit to a lot of tension at home. Kevin Roth states he only acts out when they are having family confrontations. His mother states he does not realize it, but she is seeing the irritability is more frequent. He states he cannot be truly devoted and involved with his own family due to their issues. He does note that the Keppra makes him tired in some regard, his energy is nothing like it used to be. He has lost 6 lbs since last visit. Sleep is good. He does very little driving.   He was kindly evaluated by Psychiatry with note that depressive symptoms and irritability are more likely secondary to Keppra as opposed to an underlying mood disorder.   History on Initial Assessment 04/14/2021: This is a 37 year old left-handed man with a history of one episode of status epilepticus at age 2 requiring intubation and treated by Dr. Sharene Skeans with Dilantin for 2 years, off medication since age 10 and seizure-free for 37 years until 04/06/21. He brings a bottle of apple butter and states that his mother made him  apple butter toast that morning then he immediately got tired and lay back in bed. He fell asleep and woke up with his tongue sore. His body was not sore, no incontinence. Later that afternoon, they went on errands for last minute Christmas shopping, his last memory was being at [employer]. He does not recall that they went to [facility] after, he woke up in the ambulance. His mother reports he was fine the whole time they were at [employer], they got in the car for home and he started saying he had not felt like this in 15 years, like he was "starting to float." He was in the passenger seat and his mother noticed his speech was slurred then his head turned to the right and he had a generalized convulsion. He bit the right side of his tongue with profuse bleeding. He felt normal when he woke up in the ambulance, unaware of events, no focal weakness, incontinence. He felt sore later on. He was brought to the ER where CBC showed a WBC of 14. CMP normal, UDS positive for THC. I personally reviewed head CT without contrast which was unremarkable. He was discharged home on Levetiracetam 500mg  BID.   They deny any further seizures since 04/06/21. Over the past 15 years, he has noticed intermittent episodes of deja vu with motion sickness. He denies any episodes of staring/unresponsiveness, gaps in time, olfactory/gustatory hallucinations, rising epigastric sensation, focal numbness/tingling/weakness, myoclonic jerks. He has had headaches since starting the Levetiracetam. No dizziness, diplopia, dysarthria/dysphagia, neck/back pain, bowel/bladder dysfunction. His mother reports  significant mood side effects on the Levetiracetam, he is "so ill and hateful." He states prior to LEV, mood was for the most part okay, his mother reports he is temperamental but not like it is now. Now he states his mood is lousy. He gets 8 hours of sleep, denies any sleep deprivation or alcohol intake prior to the seizure. He lives with his parents and is  currently not working.   Epilepsy Risk Factors:  He had a normal early development.  There is no history of febrile convulsions, CNS infections such as meningitis/encephalitis, significant traumatic brain injury, neurosurgical procedures, or family history of seizures.  Prior AEDs: Dilantin  Diagnostic Data: MRI brain with and without contrast done 05/2021 no acute changes, hippocampi symmetric with no abnormal signal or enhancement.  24-hour EEG in 05/2021 captured 2 30-second left temporal seizures during push button events for sensation of motion sickness, swimmyheadedness, little turning of the stomach, no loss of awareness.    PAST MEDICAL HISTORY: Past Medical History:  Diagnosis Date   Seizures (HCC)     MEDICATIONS: Current Outpatient Medications on File Prior to Visit  Medication Sig Dispense Refill   levETIRAcetam (KEPPRA) 1000 MG tablet Take 2 tablets two times daily 360 tablet 3   No current facility-administered medications on file prior to visit.    ALLERGIES: No Known Allergies  FAMILY HISTORY: History reviewed. No pertinent family history.  SOCIAL HISTORY: Social History   Socioeconomic History   Marital status: Single    Spouse name: Not on file   Number of children: Not on file   Years of education: Not on file   Highest education level: Not on file  Occupational History   Not on file  Tobacco Use   Smoking status: Every Day    Types: Cigars   Smokeless tobacco: Never  Vaping Use   Vaping status: Never Used  Substance and Sexual Activity   Alcohol use: Not Currently    Comment: Socially   Drug use: No   Sexual activity: Not on file  Other Topics Concern   Not on file  Social History Narrative   Left handed    Social Determinants of Health   Financial Resource Strain: Not on file  Food Insecurity: Not on file  Transportation Needs: Not on file  Physical Activity: Not on file  Stress: Not on file  Social Connections: Not on file   Intimate Partner Violence: Not on file     PHYSICAL EXAM: Vitals:   12/12/22 1521  BP: 118/72  Pulse: 74  SpO2: 98%   General: No acute distress Head:  Normocephalic/atraumatic Skin/Extremities: No rash, no edema Neurological Exam: alert and awake. No aphasia or dysarthria. Fund of knowledge is appropriate.   Attention and concentration are normal.   Cranial nerves: Pupils equal, round. Extraocular movements intact with no nystagmus. Visual fields full.  No facial asymmetry.  Motor: Bulk and tone normal, muscle strength 5/5 throughout with no pronator drift.   Finger to nose testing intact.  Gait narrow-based and steady, able to tandem walk adequately.  Romberg negative.   IMPRESSION: This is a 37 yo LH man with left temporal lobe epilepsy. He had an episode of status epilepticus at age 56 and was on Dilantin for 2 years, off medication and seizure-free for 29 years until seizures recurred in December 2022. He has been seizure-free since 09/2021 on Levetiracetam 2000mg  BID. We again discussed that our goal is no seizures, no side effects, and that with depression  and irritability reported and evaluated by Psychiatry, there is no underlying mood disorder but that symptoms are due to Keppra. We discussed option of adding on Lamotrigine to reduce (and not completely stop, since he is comfortable with this medication) Levetiracetam, however he is hesitant about starting another medication with it's own set of side effects. There is some evidence vitamin B6 100mg  daily can help with Keppra-related irritability, he will give it a try. He has prn Nayz[place]am for rescue. He is aware of [place] driving laws to stop driving after a seizure until 6 months seizure-free. Follow-up in 6 months, call for any changes.   Thank you for allowing me to participate in his care.  Please do not hesitate to call for any questions or concerns.    Patrcia Dolly, M.D.   CC: Dr. Azucena Cecil

## 2022-12-12 NOTE — Patient Instructions (Addendum)
Good to see you.  Continue Keppra 1000mg : 2 tablets twice a day  2. Start taking vitamin B6 (pyridoxine): 50mg  daily. If no change in mood issues, can increase to 100mg  daily  3. Follow-up in 6 months, call for any changes   Seizure Precautions: 1. If medication has been prescribed for you to prevent seizures, take it exactly as directed.  Do not stop taking the medicine without talking to your doctor first, even if you have not had a seizure in a long time.   2. Avoid activities in which a seizure would cause danger to yourself or to others.  Don't operate dangerous machinery, swim alone, or climb in high or dangerous places, such as on ladders, roofs, or girders.  Do not drive unless your doctor says you may.  3. If you have any warning that you may have a seizure, lay down in a safe place where you can't hurt yourself.    4.  No driving for 6 months from last seizure, as per [facility].   Please refer to the following link on the Epilepsy Foundation of America's website for more information: http://www.epilepsyfoundation.org/answerplace/Social/driving/drivingu.cfm   5.  Maintain good sleep hygiene. Avoid alcohol.  6.  Contact your doctor if you have any problems that may be related to the medicine you are taking.  7.  Call 911 and bring the patient back to the ED if:        A.  The seizure lasts longer than 5 minutes.       B.  The patient doesn't awaken shortly after the seizure  C.  The patient has new problems such as difficulty seeing, speaking or moving  D.  The patient was injured during the seizure  E.  The patient has a temperature over 102 F (39C)  F.  The patient vomited and now is having trouble breathing

## 2023-02-24 DIAGNOSIS — Z23 Encounter for immunization: Secondary | ICD-10-CM | POA: Diagnosis not present

## 2023-02-24 DIAGNOSIS — Z0000 Encounter for general adult medical examination without abnormal findings: Secondary | ICD-10-CM | POA: Diagnosis not present

## 2023-02-24 DIAGNOSIS — E7800 Pure hypercholesterolemia, unspecified: Secondary | ICD-10-CM | POA: Diagnosis not present

## 2023-02-24 DIAGNOSIS — G40109 Localization-related (focal) (partial) symptomatic epilepsy and epileptic syndromes with simple partial seizures, not intractable, without status epilepticus: Secondary | ICD-10-CM | POA: Diagnosis not present

## 2023-03-19 ENCOUNTER — Emergency Department (HOSPITAL_COMMUNITY): Payer: BC Managed Care – PPO

## 2023-03-19 ENCOUNTER — Inpatient Hospital Stay (HOSPITAL_COMMUNITY)
Admission: EM | Admit: 2023-03-19 | Discharge: 2023-03-23 | DRG: 500 | Disposition: A | Discharge status: Home / Self Care | Payer: BC Managed Care – PPO | Attending: General Surgery | Admitting: General Surgery

## 2023-03-19 DIAGNOSIS — G40409 Other generalized epilepsy and epileptic syndromes, not intractable, without status epilepticus: Secondary | ICD-10-CM | POA: Diagnosis present

## 2023-03-19 DIAGNOSIS — R609 Edema, unspecified: Secondary | ICD-10-CM | POA: Diagnosis not present

## 2023-03-19 DIAGNOSIS — S064XAA Epidural hemorrhage with loss of consciousness status unknown, initial encounter: Secondary | ICD-10-CM

## 2023-03-19 DIAGNOSIS — S0291XA Unspecified fracture of skull, initial encounter for closed fracture: Principal | ICD-10-CM

## 2023-03-19 DIAGNOSIS — S0219XA Other fracture of base of skull, initial encounter for closed fracture: Secondary | ICD-10-CM | POA: Diagnosis not present

## 2023-03-19 DIAGNOSIS — S022XXA Fracture of nasal bones, initial encounter for closed fracture: Secondary | ICD-10-CM | POA: Diagnosis present

## 2023-03-19 DIAGNOSIS — F411 Generalized anxiety disorder: Secondary | ICD-10-CM | POA: Diagnosis not present

## 2023-03-19 DIAGNOSIS — S2242XA Multiple fractures of ribs, left side, initial encounter for closed fracture: Secondary | ICD-10-CM | POA: Diagnosis present

## 2023-03-19 DIAGNOSIS — S52252A Displaced comminuted fracture of shaft of ulna, left arm, initial encounter for closed fracture: Secondary | ICD-10-CM | POA: Diagnosis not present

## 2023-03-19 DIAGNOSIS — S02121A Fracture of orbital roof, right side, initial encounter for closed fracture: Secondary | ICD-10-CM | POA: Diagnosis not present

## 2023-03-19 DIAGNOSIS — F17290 Nicotine dependence, other tobacco product, uncomplicated: Secondary | ICD-10-CM | POA: Diagnosis not present

## 2023-03-19 DIAGNOSIS — S066XAA Traumatic subarachnoid hemorrhage with loss of consciousness status unknown, initial encounter: Secondary | ICD-10-CM | POA: Diagnosis present

## 2023-03-19 DIAGNOSIS — T07XXXA Unspecified multiple injuries, initial encounter: Secondary | ICD-10-CM | POA: Diagnosis not present

## 2023-03-19 DIAGNOSIS — J0110 Acute frontal sinusitis, unspecified: Secondary | ICD-10-CM | POA: Diagnosis not present

## 2023-03-19 DIAGNOSIS — H0520 Unspecified exophthalmos: Secondary | ICD-10-CM | POA: Diagnosis present

## 2023-03-19 DIAGNOSIS — S0285XA Fracture of orbit, unspecified, initial encounter for closed fracture: Secondary | ICD-10-CM

## 2023-03-19 DIAGNOSIS — S62102A Fracture of unspecified carpal bone, left wrist, initial encounter for closed fracture: Secondary | ICD-10-CM

## 2023-03-19 DIAGNOSIS — S0242XA Fracture of alveolus of maxilla, initial encounter for closed fracture: Secondary | ICD-10-CM | POA: Diagnosis present

## 2023-03-19 DIAGNOSIS — S299XXA Unspecified injury of thorax, initial encounter: Secondary | ICD-10-CM | POA: Diagnosis not present

## 2023-03-19 DIAGNOSIS — M79605 Pain in left leg: Secondary | ICD-10-CM | POA: Diagnosis not present

## 2023-03-19 DIAGNOSIS — S52615A Nondisplaced fracture of left ulna styloid process, initial encounter for closed fracture: Secondary | ICD-10-CM | POA: Diagnosis not present

## 2023-03-19 DIAGNOSIS — S6292XA Unspecified fracture of left wrist and hand, initial encounter for closed fracture: Secondary | ICD-10-CM | POA: Diagnosis not present

## 2023-03-19 DIAGNOSIS — S52352A Displaced comminuted fracture of shaft of radius, left arm, initial encounter for closed fracture: Secondary | ICD-10-CM | POA: Diagnosis not present

## 2023-03-19 DIAGNOSIS — Z23 Encounter for immunization: Secondary | ICD-10-CM | POA: Diagnosis not present

## 2023-03-19 DIAGNOSIS — I609 Nontraumatic subarachnoid hemorrhage, unspecified: Secondary | ICD-10-CM

## 2023-03-19 DIAGNOSIS — S199XXA Unspecified injury of neck, initial encounter: Secondary | ICD-10-CM | POA: Diagnosis not present

## 2023-03-19 DIAGNOSIS — Z560 Unemployment, unspecified: Secondary | ICD-10-CM

## 2023-03-19 DIAGNOSIS — S066X0A Traumatic subarachnoid hemorrhage without loss of consciousness, initial encounter: Secondary | ICD-10-CM | POA: Diagnosis not present

## 2023-03-19 DIAGNOSIS — S52602A Unspecified fracture of lower end of left ulna, initial encounter for closed fracture: Secondary | ICD-10-CM | POA: Diagnosis not present

## 2023-03-19 DIAGNOSIS — R4587 Impulsiveness: Secondary | ICD-10-CM | POA: Diagnosis present

## 2023-03-19 DIAGNOSIS — F329 Major depressive disorder, single episode, unspecified: Secondary | ICD-10-CM | POA: Diagnosis not present

## 2023-03-19 DIAGNOSIS — S02122A Fracture of orbital roof, left side, initial encounter for closed fracture: Secondary | ICD-10-CM | POA: Diagnosis not present

## 2023-03-19 DIAGNOSIS — S0512XA Contusion of eyeball and orbital tissues, left eye, initial encounter: Secondary | ICD-10-CM | POA: Diagnosis not present

## 2023-03-19 DIAGNOSIS — S3991XA Unspecified injury of abdomen, initial encounter: Secondary | ICD-10-CM | POA: Diagnosis not present

## 2023-03-19 DIAGNOSIS — R1111 Vomiting without nausea: Secondary | ICD-10-CM | POA: Diagnosis not present

## 2023-03-19 DIAGNOSIS — S0240DA Maxillary fracture, left side, initial encounter for closed fracture: Secondary | ICD-10-CM | POA: Diagnosis not present

## 2023-03-19 DIAGNOSIS — S3993XA Unspecified injury of pelvis, initial encounter: Secondary | ICD-10-CM | POA: Diagnosis not present

## 2023-03-19 DIAGNOSIS — S52502A Unspecified fracture of the lower end of left radius, initial encounter for closed fracture: Secondary | ICD-10-CM | POA: Diagnosis not present

## 2023-03-19 DIAGNOSIS — T50905A Adverse effect of unspecified drugs, medicaments and biological substances, initial encounter: Secondary | ICD-10-CM | POA: Diagnosis not present

## 2023-03-19 DIAGNOSIS — S52572A Other intraarticular fracture of lower end of left radius, initial encounter for closed fracture: Principal | ICD-10-CM | POA: Diagnosis present

## 2023-03-19 DIAGNOSIS — S0282XA Fracture of other specified skull and facial bones, left side, initial encounter for closed fracture: Secondary | ICD-10-CM | POA: Diagnosis not present

## 2023-03-19 LAB — COMPREHENSIVE METABOLIC PANEL
ALT: 18 U/L (ref 0–44)
AST: 32 U/L (ref 15–41)
Albumin: 4.1 g/dL (ref 3.5–5.0)
Alkaline Phosphatase: 45 U/L (ref 38–126)
Anion gap: 10 (ref 5–15)
BUN: 15 mg/dL (ref 6–20)
CO2: 24 mmol/L (ref 22–32)
Calcium: 8.8 mg/dL — ABNORMAL LOW (ref 8.9–10.3)
Chloride: 106 mmol/L (ref 98–111)
Creatinine, Ser: 1.19 mg/dL (ref 0.61–1.24)
GFR, Estimated: >60 mL/min (ref >60)
Glucose, Bld: 124 mg/dL — ABNORMAL HIGH (ref 70–99)
Potassium: 3.4 mmol/L — ABNORMAL LOW (ref 3.5–5.1)
Sodium: 140 mmol/L (ref 135–145)
Total Bilirubin: 0.4 mg/dL (ref <1.2)
Total Protein: 6.7 g/dL (ref 6.5–8.1)

## 2023-03-19 LAB — PROTIME-INR
INR: 1.1 (ref 0.8–1.2)
Prothrombin Time: 14.8 s (ref 11.4–15.2)

## 2023-03-19 LAB — I-STAT CHEM 8, ED
BUN: 15 mg/dL (ref 6–20)
Calcium, Ion: 1 mmol/L — ABNORMAL LOW (ref 1.15–1.40)
Chloride: 107 mmol/L (ref 98–111)
Creatinine, Ser: 1.1 mg/dL (ref 0.61–1.24)
Glucose, Bld: 116 mg/dL — ABNORMAL HIGH (ref 70–99)
HCT: 43 % (ref 39.0–52.0)
Hemoglobin: 14.6 g/dL (ref 13.0–17.0)
Potassium: 3.5 mmol/L (ref 3.5–5.1)
Sodium: 142 mmol/L (ref 135–145)
TCO2: 22 mmol/L (ref 22–32)

## 2023-03-19 LAB — CBC
HCT: 42.3 % (ref 39.0–52.0)
Hemoglobin: 14.5 g/dL (ref 13.0–17.0)
MCH: 32.2 pg (ref 26.0–34.0)
MCHC: 34.3 g/dL (ref 30.0–36.0)
MCV: 93.8 fL (ref 80.0–100.0)
Platelets: 191 10*3/uL (ref 150–400)
RBC: 4.51 MIL/uL (ref 4.22–5.81)
RDW: 11.9 % (ref 11.5–15.5)
WBC: 13.4 10*3/uL — ABNORMAL HIGH (ref 4.0–10.5)
nRBC: 0 % (ref 0.0–0.2)

## 2023-03-19 LAB — I-STAT CG4 LACTIC ACID, ED: Lactic Acid, Venous: 6.3 mmol/L (ref 0.5–1.9)

## 2023-03-19 LAB — SAMPLE TO BLOOD BANK

## 2023-03-19 LAB — ETHANOL: Alcohol, Ethyl (B): <10 mg/dL (ref <10)

## 2023-03-19 MED ORDER — DROPERIDOL 2.5 MG/ML IJ SOLN
2.5000 mg | Freq: Once | INTRAMUSCULAR | Status: AC
  Administered 2023-03-19: 2.5 mg via INTRAMUSCULAR

## 2023-03-19 MED ORDER — DIPHENHYDRAMINE HCL 50 MG/ML IJ SOLN
25.0000 mg | Freq: Once | INTRAMUSCULAR | Status: AC
  Administered 2023-03-19: 25 mg via INTRAMUSCULAR

## 2023-03-19 MED ORDER — CEFAZOLIN SODIUM-DEXTROSE 2-4 GM/100ML-% IV SOLN
2.0000 g | Freq: Once | INTRAVENOUS | Status: AC
  Administered 2023-03-19: 2 g via INTRAVENOUS

## 2023-03-19 MED ORDER — LEVETIRACETAM IN NACL 1500 MG/100ML IV SOLN
1500.0000 mg | Freq: Once | INTRAVENOUS | Status: AC
  Administered 2023-03-19: 1500 mg via INTRAVENOUS
  Filled 2023-03-19: qty 100

## 2023-03-19 MED ORDER — DROPERIDOL 2.5 MG/ML IJ SOLN
1.2500 mg | Freq: Once | INTRAMUSCULAR | Status: AC
  Administered 2023-03-19: 1.25 mg via INTRAVENOUS
  Filled 2023-03-19: qty 2

## 2023-03-19 MED ORDER — TETANUS-DIPHTH-ACELL PERTUSSIS 5-2.5-18.5 LF-MCG/0.5 IM SUSY
0.5000 mL | PREFILLED_SYRINGE | Freq: Once | INTRAMUSCULAR | Status: AC
Start: 2023-03-19 — End: 2023-03-19
  Administered 2023-03-19: 0.5 mL via INTRAMUSCULAR

## 2023-03-19 NOTE — Progress Notes (Signed)
Orthopedic Tech Progress Note Patient Details:  Kevin Roth [place] [birth year] [id number]  Ortho Devices Type of Ortho Device: Arm sling, Sugartong splint Ortho Device/Splint Location: lue Ortho Device/Splint Interventions: Ordered, Application, Adjustment  I applied the splint with the drs assist. Post Interventions Patient Tolerated: Well Instructions Provided: Care of device, Adjustment of device  Trinna Post 03/19/2023, 11:39 PM

## 2023-03-20 ENCOUNTER — Emergency Department (HOSPITAL_COMMUNITY): Payer: BC Managed Care – PPO

## 2023-03-20 ENCOUNTER — Encounter (HOSPITAL_COMMUNITY): Payer: Self-pay | Admitting: Emergency Medicine

## 2023-03-20 DIAGNOSIS — S62102A Fracture of unspecified carpal bone, left wrist, initial encounter for closed fracture: Secondary | ICD-10-CM

## 2023-03-20 DIAGNOSIS — S02121A Fracture of orbital roof, right side, initial encounter for closed fracture: Secondary | ICD-10-CM | POA: Diagnosis not present

## 2023-03-20 DIAGNOSIS — S064XAA Epidural hemorrhage with loss of consciousness status unknown, initial encounter: Secondary | ICD-10-CM | POA: Diagnosis not present

## 2023-03-20 DIAGNOSIS — S022XXA Fracture of nasal bones, initial encounter for closed fracture: Secondary | ICD-10-CM | POA: Diagnosis present

## 2023-03-20 DIAGNOSIS — S0291XA Unspecified fracture of skull, initial encounter for closed fracture: Secondary | ICD-10-CM | POA: Diagnosis not present

## 2023-03-20 DIAGNOSIS — F17290 Nicotine dependence, other tobacco product, uncomplicated: Secondary | ICD-10-CM | POA: Diagnosis present

## 2023-03-20 DIAGNOSIS — S066X0A Traumatic subarachnoid hemorrhage without loss of consciousness, initial encounter: Secondary | ICD-10-CM | POA: Diagnosis not present

## 2023-03-20 DIAGNOSIS — R4587 Impulsiveness: Secondary | ICD-10-CM | POA: Diagnosis present

## 2023-03-20 DIAGNOSIS — S0242XA Fracture of alveolus of maxilla, initial encounter for closed fracture: Secondary | ICD-10-CM | POA: Diagnosis present

## 2023-03-20 DIAGNOSIS — S199XXA Unspecified injury of neck, initial encounter: Secondary | ICD-10-CM | POA: Diagnosis not present

## 2023-03-20 DIAGNOSIS — S0285XA Fracture of orbit, unspecified, initial encounter for closed fracture: Secondary | ICD-10-CM

## 2023-03-20 DIAGNOSIS — T50905A Adverse effect of unspecified drugs, medicaments and biological substances, initial encounter: Secondary | ICD-10-CM | POA: Diagnosis not present

## 2023-03-20 DIAGNOSIS — S2242XA Multiple fractures of ribs, left side, initial encounter for closed fracture: Secondary | ICD-10-CM

## 2023-03-20 DIAGNOSIS — S52602A Unspecified fracture of lower end of left ulna, initial encounter for closed fracture: Secondary | ICD-10-CM | POA: Diagnosis not present

## 2023-03-20 DIAGNOSIS — S0240DA Maxillary fracture, left side, initial encounter for closed fracture: Secondary | ICD-10-CM | POA: Diagnosis not present

## 2023-03-20 DIAGNOSIS — S3991XA Unspecified injury of abdomen, initial encounter: Secondary | ICD-10-CM | POA: Diagnosis not present

## 2023-03-20 DIAGNOSIS — S52502A Unspecified fracture of the lower end of left radius, initial encounter for closed fracture: Secondary | ICD-10-CM | POA: Diagnosis not present

## 2023-03-20 DIAGNOSIS — S0282XA Fracture of other specified skull and facial bones, left side, initial encounter for closed fracture: Secondary | ICD-10-CM | POA: Diagnosis not present

## 2023-03-20 DIAGNOSIS — Z560 Unemployment, unspecified: Secondary | ICD-10-CM | POA: Diagnosis not present

## 2023-03-20 DIAGNOSIS — H0520 Unspecified exophthalmos: Secondary | ICD-10-CM | POA: Diagnosis present

## 2023-03-20 DIAGNOSIS — S02122A Fracture of orbital roof, left side, initial encounter for closed fracture: Secondary | ICD-10-CM | POA: Diagnosis present

## 2023-03-20 DIAGNOSIS — S066XAA Traumatic subarachnoid hemorrhage with loss of consciousness status unknown, initial encounter: Secondary | ICD-10-CM | POA: Diagnosis present

## 2023-03-20 DIAGNOSIS — Z23 Encounter for immunization: Secondary | ICD-10-CM | POA: Diagnosis not present

## 2023-03-20 DIAGNOSIS — J0110 Acute frontal sinusitis, unspecified: Secondary | ICD-10-CM | POA: Diagnosis not present

## 2023-03-20 DIAGNOSIS — F329 Major depressive disorder, single episode, unspecified: Secondary | ICD-10-CM | POA: Diagnosis present

## 2023-03-20 DIAGNOSIS — S52615A Nondisplaced fracture of left ulna styloid process, initial encounter for closed fracture: Secondary | ICD-10-CM | POA: Diagnosis not present

## 2023-03-20 DIAGNOSIS — S52572A Other intraarticular fracture of lower end of left radius, initial encounter for closed fracture: Secondary | ICD-10-CM | POA: Diagnosis present

## 2023-03-20 DIAGNOSIS — S0512XA Contusion of eyeball and orbital tissues, left eye, initial encounter: Secondary | ICD-10-CM | POA: Diagnosis not present

## 2023-03-20 DIAGNOSIS — M79605 Pain in left leg: Secondary | ICD-10-CM | POA: Diagnosis present

## 2023-03-20 DIAGNOSIS — F411 Generalized anxiety disorder: Secondary | ICD-10-CM | POA: Diagnosis present

## 2023-03-20 DIAGNOSIS — I609 Nontraumatic subarachnoid hemorrhage, unspecified: Secondary | ICD-10-CM | POA: Diagnosis not present

## 2023-03-20 DIAGNOSIS — G40409 Other generalized epilepsy and epileptic syndromes, not intractable, without status epilepticus: Secondary | ICD-10-CM | POA: Diagnosis present

## 2023-03-20 DIAGNOSIS — S0219XA Other fracture of base of skull, initial encounter for closed fracture: Secondary | ICD-10-CM | POA: Diagnosis present

## 2023-03-20 LAB — BASIC METABOLIC PANEL
Anion gap: 13 (ref 5–15)
BUN: 10 mg/dL (ref 6–20)
CO2: 19 mmol/L — ABNORMAL LOW (ref 22–32)
Calcium: 8.5 mg/dL — ABNORMAL LOW (ref 8.9–10.3)
Chloride: 108 mmol/L (ref 98–111)
Creatinine, Ser: 1.1 mg/dL (ref 0.61–1.24)
GFR, Estimated: >60 mL/min (ref >60)
Glucose, Bld: 112 mg/dL — ABNORMAL HIGH (ref 70–99)
Potassium: 3.8 mmol/L (ref 3.5–5.1)
Sodium: 140 mmol/L (ref 135–145)

## 2023-03-20 LAB — CBC
HCT: 40.9 % (ref 39.0–52.0)
Hemoglobin: 13.7 g/dL (ref 13.0–17.0)
MCH: 31.5 pg (ref 26.0–34.0)
MCHC: 33.5 g/dL (ref 30.0–36.0)
MCV: 94 fL (ref 80.0–100.0)
Platelets: 144 10*3/uL — ABNORMAL LOW (ref 150–400)
RBC: 4.35 MIL/uL (ref 4.22–5.81)
RDW: 11.9 % (ref 11.5–15.5)
WBC: 15.7 10*3/uL — ABNORMAL HIGH (ref 4.0–10.5)
nRBC: 0 % (ref 0.0–0.2)

## 2023-03-20 LAB — MRSA NEXT GEN BY PCR, NASAL: MRSA by PCR Next Gen: NOT DETECTED

## 2023-03-20 LAB — HIV ANTIBODY (ROUTINE TESTING W REFLEX): HIV Screen 4th Generation wRfx: NONREACTIVE

## 2023-03-20 MED ORDER — CHLORHEXIDINE GLUCONATE CLOTH 2 % EX PADS
6.0000 | MEDICATED_PAD | Freq: Every day | CUTANEOUS | Status: DC
Start: 2023-03-20 — End: 2023-03-23
  Administered 2023-03-20 – 2023-03-23 (×5): 6 via TOPICAL

## 2023-03-20 MED ORDER — DOCUSATE SODIUM 100 MG PO CAPS
100.0000 mg | ORAL_CAPSULE | Freq: Two times a day (BID) | ORAL | Status: DC
  Administered 2023-03-20 – 2023-03-23 (×6): 100 mg via ORAL
  Filled 2023-03-20 (×6): qty 1

## 2023-03-20 MED ORDER — POLYETHYLENE GLYCOL 3350 17 G PO PACK: 17.0000 g | PACK | Freq: Every day | ORAL | Status: DC | PRN

## 2023-03-20 MED ORDER — METOPROLOL TARTRATE 5 MG/5ML IV SOLN: 5.0000 mg | Freq: Four times a day (QID) | INTRAVENOUS | Status: DC | PRN

## 2023-03-20 MED ORDER — OXYCODONE HCL 5 MG PO TABS
5.0000 mg | ORAL_TABLET | ORAL | Status: DC | PRN
  Filled 2023-03-20 (×2): qty 1

## 2023-03-20 MED ORDER — METHOCARBAMOL 500 MG PO TABS
500.0000 mg | ORAL_TABLET | Freq: Three times a day (TID) | ORAL | Status: DC
  Administered 2023-03-20 – 2023-03-21 (×3): 500 mg via ORAL
  Filled 2023-03-20 (×3): qty 1

## 2023-03-20 MED ORDER — IOHEXOL 350 MG/ML SOLN
75.0000 mL | Freq: Once | INTRAVENOUS | Status: AC | PRN
  Administered 2023-03-20: 75 mL via INTRAVENOUS

## 2023-03-20 MED ORDER — LEVETIRACETAM IN NACL 1000 MG/100ML IV SOLN
1000.0000 mg | Freq: Two times a day (BID) | INTRAVENOUS | Status: DC
  Filled 2023-03-20: qty 100

## 2023-03-20 MED ORDER — LEVETIRACETAM IN NACL 1500 MG/100ML IV SOLN
1500.0000 mg | Freq: Once | INTRAVENOUS | Status: AC
  Administered 2023-03-20: 1500 mg via INTRAVENOUS
  Filled 2023-03-20: qty 100

## 2023-03-20 MED ORDER — ONDANSETRON 4 MG PO TBDP
4.0000 mg | ORAL_TABLET | Freq: Four times a day (QID) | ORAL | Status: DC | PRN
  Administered 2023-03-20: 4 mg via ORAL
  Filled 2023-03-20: qty 1

## 2023-03-20 MED ORDER — ORAL CARE MOUTH RINSE: 15.0000 mL | OROMUCOSAL | Status: DC | PRN

## 2023-03-20 MED ORDER — VITAMIN B-6 25 MG PO TABS
25.0000 mg | ORAL_TABLET | Freq: Every day | ORAL | Status: DC
  Administered 2023-03-21: 25 mg via ORAL
  Filled 2023-03-20: qty 1

## 2023-03-20 MED ORDER — METHOCARBAMOL 1000 MG/10ML IJ SOLN
500.0000 mg | Freq: Three times a day (TID) | INTRAMUSCULAR | Status: DC
  Administered 2023-03-20: 500 mg via INTRAVENOUS
  Filled 2023-03-20: qty 10

## 2023-03-20 MED ORDER — FENTANYL CITRATE PF 50 MCG/ML IJ SOSY
50.0000 ug | PREFILLED_SYRINGE | Freq: Once | INTRAMUSCULAR | Status: AC
  Administered 2023-03-20: 50 ug via INTRAVENOUS
  Filled 2023-03-20: qty 1

## 2023-03-20 MED ORDER — SODIUM CHLORIDE 0.9 % IV SOLN: 4500.0000 mg | Freq: Once | INTRAVENOUS | Status: DC

## 2023-03-20 MED ORDER — HYDRALAZINE HCL 20 MG/ML IJ SOLN: 10.0000 mg | INTRAMUSCULAR | Status: DC | PRN

## 2023-03-20 MED ORDER — ACETAMINOPHEN 500 MG PO TABS
1000.0000 mg | ORAL_TABLET | Freq: Four times a day (QID) | ORAL | Status: DC
  Administered 2023-03-20 – 2023-03-23 (×11): 1000 mg via ORAL
  Filled 2023-03-20 (×12): qty 2

## 2023-03-20 MED ORDER — ONDANSETRON HCL 4 MG/2ML IJ SOLN
4.0000 mg | Freq: Once | INTRAMUSCULAR | Status: AC
Start: 2023-03-20 — End: 2023-03-20
  Administered 2023-03-20: 4 mg via INTRAVENOUS
  Filled 2023-03-20: qty 2

## 2023-03-20 MED ORDER — HYDROMORPHONE HCL 1 MG/ML IJ SOLN: 1.0000 mg | INTRAMUSCULAR | Status: DC | PRN

## 2023-03-20 MED ORDER — SODIUM CHLORIDE 0.9 % IV SOLN: INTRAVENOUS | Status: DC

## 2023-03-20 MED ORDER — LEVETIRACETAM IN NACL 500 MG/100ML IV SOLN: 500.0000 mg | Freq: Two times a day (BID) | INTRAVENOUS | Status: DC

## 2023-03-20 MED ORDER — LEVETIRACETAM 750 MG PO TABS
2000.0000 mg | ORAL_TABLET | Freq: Two times a day (BID) | ORAL | Status: DC
  Administered 2023-03-20 (×2): 2000 mg via ORAL
  Filled 2023-03-20 (×2): qty 1

## 2023-03-20 MED ORDER — ONDANSETRON HCL 4 MG/2ML IJ SOLN
4.0000 mg | Freq: Four times a day (QID) | INTRAMUSCULAR | Status: DC | PRN
  Administered 2023-03-21: 4 mg via INTRAVENOUS
  Filled 2023-03-20: qty 2

## 2023-03-20 NOTE — ED Provider Notes (Signed)
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 admission considering the current resources, flow, and capabilities available in the ED at this time, and I doubt any other [facility] requiring further screening and/or treatment in the ED prior to admission.  Rx / DC Orders ED Discharge Orders     None         Solita Macadam, MD 03/20/23 0151
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
## 2023-03-20 NOTE — Plan of Care (Signed)
  Problem: Education: Goal: Knowledge of General Education information will improve Description: Including pain rating scale, medication(s)/side effects and non-pharmacologic comfort measures Outcome: Not Progressing   Problem: Health Behavior/Discharge Planning: Goal: Ability to manage health-related needs will improve Outcome: Not Progressing   Problem: Clinical Measurements: Goal: Ability to maintain clinical measurements within normal limits will improve Outcome: Not Progressing Goal: Will remain free from infection Outcome: Not Progressing Goal: Diagnostic test results will improve Outcome: Not Progressing Goal: Respiratory complications will improve Outcome: Not Progressing Goal: Cardiovascular complication will be avoided Outcome: Not Progressing   Problem: Activity: Goal: Risk for activity intolerance will decrease Outcome: Not Progressing   Problem: Nutrition: Goal: Adequate nutrition will be maintained Outcome: Not Progressing   Problem: Coping: Goal: Level of anxiety will decrease Outcome: Not Progressing   Problem: Pain Management: Goal: General experience of comfort will improve Outcome: Not Progressing   Problem: Safety: Goal: Ability to remain free from injury will improve Outcome: Not Progressing   Problem: Skin Integrity: Goal: Risk for impaired skin integrity will decrease Outcome: Not Progressing
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
## 2023-03-20 NOTE — Progress Notes (Signed)
Transition of Care [facility]) - CAGE-AID Screening   Patient Details  Name: Kevin Roth MRN: [id number] Date of Birth: July 31, 1985  [facility] CM/SW Contact:    Katha Hamming, RN Phone Number: 03/20/2023, 8:58 PM     CAGE-AID Screening:    Have You Ever Felt You Ought to Cut Down on Your Drinking or Drug Use?: No Have People Annoyed You By Critizing Your Drinking Or Drug Use?: No Have You Felt Bad Or Guilty About Your Drinking Or Drug Use?: No Have You Ever Had a Drink or Used Drugs First Thing In The Morning to Steady Your Nerves or to Get Rid of a Hangover?: No CAGE-AID Score: 0  Substance Abuse Education Offered: No
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
## 2023-03-20 NOTE — Consult Note (Signed)
CHART REVIEWED PT WITH CLOSED LEFT DISTAL RADIUS AND ULNA FRACTURE WILL REQUIRE SURGERY WILL PLAN FOR ORIF OF LEFT WRIST ON WEDNESDAY WILL CONTACT OR AND PLACE PREOP ORDERS WILL DISCUSS WITH PATIENT PLAN NOTES FROM NEUROSURGERY AND ENT REVIEWED CT SCAN AND PLAIN FILMS REVIEWED PREOP ORDERS TO BE PLACED ICE/ELEVATE CONTINUE WITH SUGARTONG SPLINT UNTIL SURGERY
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
## 2023-03-21 LAB — RAPID URINE DRUG SCREEN, HOSP PERFORMED
Amphetamines: NOT DETECTED
Barbiturates: NOT DETECTED
Benzodiazepines: NOT DETECTED
Cocaine: NOT DETECTED
Opiates: NOT DETECTED
Tetrahydrocannabinol: POSITIVE — AB

## 2023-03-21 MED ORDER — POLYETHYLENE GLYCOL 3350 17 G PO PACK
17.0000 g | PACK | Freq: Every day | ORAL | Status: DC
  Administered 2023-03-21 – 2023-03-23 (×3): 17 g via ORAL
  Filled 2023-03-21 (×3): qty 1

## 2023-03-21 MED ORDER — HYDROMORPHONE HCL 1 MG/ML IJ SOLN
0.5000 mg | INTRAMUSCULAR | Status: DC | PRN
  Administered 2023-03-23: 1 mg via INTRAVENOUS
  Filled 2023-03-21: qty 1

## 2023-03-21 MED ORDER — VITAMIN B-6 100 MG PO TABS
100.0000 mg | ORAL_TABLET | Freq: Every day | ORAL | Status: DC
  Administered 2023-03-22 – 2023-03-23 (×2): 100 mg via ORAL
  Filled 2023-03-21 (×2): qty 1

## 2023-03-21 MED ORDER — LEVETIRACETAM 750 MG PO TABS
2000.0000 mg | ORAL_TABLET | Freq: Two times a day (BID) | ORAL | Status: DC
  Administered 2023-03-21 – 2023-03-23 (×5): 2000 mg via ORAL
  Filled 2023-03-21: qty 1
  Filled 2023-03-21 (×3): qty 4
  Filled 2023-03-21: qty 1

## 2023-03-21 MED ORDER — ENOXAPARIN SODIUM 30 MG/0.3ML IJ SOSY
30.0000 mg | PREFILLED_SYRINGE | Freq: Two times a day (BID) | INTRAMUSCULAR | Status: DC
  Administered 2023-03-21 – 2023-03-23 (×5): 30 mg via SUBCUTANEOUS
  Filled 2023-03-21 (×5): qty 0.3

## 2023-03-21 MED ORDER — METHOCARBAMOL 500 MG PO TABS
1000.0000 mg | ORAL_TABLET | Freq: Three times a day (TID) | ORAL | Status: AC
  Administered 2023-03-21 – 2023-03-22 (×4): 1000 mg via ORAL
  Filled 2023-03-21 (×4): qty 2

## 2023-03-21 MED ORDER — CEFAZOLIN SODIUM-DEXTROSE 2-4 GM/100ML-% IV SOLN
2.0000 g | INTRAVENOUS | Status: AC
  Administered 2023-03-22: 2 g via INTRAVENOUS
  Filled 2023-03-21: qty 100

## 2023-03-21 MED ORDER — OXYCODONE HCL 5 MG PO TABS
5.0000 mg | ORAL_TABLET | ORAL | Status: DC | PRN
  Administered 2023-03-23 (×4): 10 mg via ORAL
  Filled 2023-03-21 (×4): qty 2
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
## 2023-03-22 ENCOUNTER — Encounter (HOSPITAL_COMMUNITY): Payer: Self-pay

## 2023-03-22 ENCOUNTER — Other Ambulatory Visit: Payer: Self-pay

## 2023-03-22 ENCOUNTER — Inpatient Hospital Stay (HOSPITAL_COMMUNITY): Payer: BC Managed Care – PPO

## 2023-03-22 ENCOUNTER — Encounter (HOSPITAL_COMMUNITY): Admission: EM | Disposition: A | Discharge status: Home / Self Care | Payer: Self-pay | Source: Home / Self Care

## 2023-03-22 HISTORY — PX: ORIF WRIST FRACTURE: SHX2133

## 2023-03-22 LAB — CBC
HCT: 38.4 % — ABNORMAL LOW (ref 39.0–52.0)
Hemoglobin: 13.5 g/dL (ref 13.0–17.0)
MCH: 31.9 pg (ref 26.0–34.0)
MCHC: 35.2 g/dL (ref 30.0–36.0)
MCV: 90.8 fL (ref 80.0–100.0)
Platelets: 150 10*3/uL (ref 150–400)
RBC: 4.23 MIL/uL (ref 4.22–5.81)
RDW: 11.7 % (ref 11.5–15.5)
WBC: 9.2 10*3/uL (ref 4.0–10.5)
nRBC: 0 % (ref 0.0–0.2)

## 2023-03-22 LAB — GLUCOSE, CAPILLARY: Glucose-Capillary: 88 mg/dL (ref 70–99)

## 2023-03-22 SURGERY — OPEN REDUCTION INTERNAL FIXATION (ORIF) WRIST FRACTURE
Anesthesia: Monitor Anesthesia Care | Site: Wrist | Laterality: Left

## 2023-03-22 MED ORDER — CHLORHEXIDINE GLUCONATE 0.12 % MT SOLN
OROMUCOSAL | Status: AC
  Filled 2023-03-22: qty 15

## 2023-03-22 MED ORDER — PROPOFOL 10 MG/ML IV BOLUS
INTRAVENOUS | Status: AC
  Filled 2023-03-22: qty 20

## 2023-03-22 MED ORDER — PROPOFOL 500 MG/50ML IV EMUL
INTRAVENOUS | Status: DC | PRN
  Administered 2023-03-22: 120 ug/kg/min via INTRAVENOUS

## 2023-03-22 MED ORDER — LIDOCAINE 2% (20 MG/ML) 5 ML SYRINGE
INTRAMUSCULAR | Status: AC
  Filled 2023-03-22: qty 5

## 2023-03-22 MED ORDER — ONDANSETRON HCL 4 MG/2ML IJ SOLN
INTRAMUSCULAR | Status: DC | PRN
  Administered 2023-03-22: 4 mg via INTRAVENOUS

## 2023-03-22 MED ORDER — CEFAZOLIN SODIUM-DEXTROSE 2-4 GM/100ML-% IV SOLN
2.0000 g | Freq: Four times a day (QID) | INTRAVENOUS | Status: AC
  Administered 2023-03-22 – 2023-03-23 (×2): 2 g via INTRAVENOUS
  Filled 2023-03-22 (×2): qty 100

## 2023-03-22 MED ORDER — MIDAZOLAM HCL 2 MG/2ML IJ SOLN
INTRAMUSCULAR | Status: AC
Start: 2023-03-22 — End: ?
  Filled 2023-03-22: qty 2

## 2023-03-22 MED ORDER — FENTANYL CITRATE (PF) 250 MCG/5ML IJ SOLN
INTRAMUSCULAR | Status: AC
  Filled 2023-03-22: qty 5

## 2023-03-22 MED ORDER — ORAL CARE MOUTH RINSE: 15.0000 mL | Freq: Once | OROMUCOSAL | Status: AC

## 2023-03-22 MED ORDER — MIDAZOLAM HCL 2 MG/2ML IJ SOLN
INTRAMUSCULAR | Status: AC
  Administered 2023-03-22: 2 mg via INTRAVENOUS
  Filled 2023-03-22: qty 2

## 2023-03-22 MED ORDER — BUPIVACAINE HCL (PF) 0.25 % IJ SOLN
INTRAMUSCULAR | Status: AC
  Filled 2023-03-22: qty 30

## 2023-03-22 MED ORDER — MIDAZOLAM HCL 2 MG/2ML IJ SOLN: 2.0000 mg | Freq: Once | INTRAMUSCULAR | Status: AC

## 2023-03-22 MED ORDER — LIDOCAINE HCL (CARDIAC) PF 100 MG/5ML IV SOSY
PREFILLED_SYRINGE | INTRAVENOUS | Status: DC | PRN
  Administered 2023-03-22: 20 mg via INTRAVENOUS

## 2023-03-22 MED ORDER — HYDROMORPHONE HCL 1 MG/ML IJ SOLN: 0.2500 mg | INTRAMUSCULAR | Status: DC | PRN

## 2023-03-22 MED ORDER — PROPOFOL 10 MG/ML IV BOLUS
INTRAVENOUS | Status: DC | PRN
  Administered 2023-03-22: 20 mg via INTRAVENOUS
  Administered 2023-03-22: 30 mg via INTRAVENOUS
  Administered 2023-03-22: 50 mg via INTRAVENOUS

## 2023-03-22 MED ORDER — FENTANYL CITRATE (PF) 100 MCG/2ML IJ SOLN: 100.0000 ug | Freq: Once | INTRAMUSCULAR | Status: AC

## 2023-03-22 MED ORDER — ROPIVACAINE HCL 5 MG/ML IJ SOLN
INTRAMUSCULAR | Status: DC | PRN
  Administered 2023-03-22: 30 mL via PERINEURAL

## 2023-03-22 MED ORDER — LACTATED RINGERS IV SOLN: INTRAVENOUS | Status: DC

## 2023-03-22 MED ORDER — METOCLOPRAMIDE HCL 5 MG/ML IJ SOLN: 5.0000 mg | Freq: Three times a day (TID) | INTRAMUSCULAR | Status: DC | PRN

## 2023-03-22 MED ORDER — FENTANYL CITRATE (PF) 100 MCG/2ML IJ SOLN
INTRAMUSCULAR | Status: AC
  Administered 2023-03-22: 100 ug via INTRAVENOUS
  Filled 2023-03-22: qty 2

## 2023-03-22 MED ORDER — CHLORHEXIDINE GLUCONATE 0.12 % MT SOLN
15.0000 mL | Freq: Once | OROMUCOSAL | Status: AC
  Administered 2023-03-22: 15 mL via OROMUCOSAL
  Filled 2023-03-22: qty 15

## 2023-03-22 MED ORDER — DOCUSATE SODIUM 100 MG PO CAPS: 100.0000 mg | ORAL_CAPSULE | Freq: Two times a day (BID) | ORAL | Status: DC

## 2023-03-22 MED ORDER — METOCLOPRAMIDE HCL 5 MG PO TABS: 5.0000 mg | ORAL_TABLET | Freq: Three times a day (TID) | ORAL | Status: DC | PRN

## 2023-03-22 SURGICAL SUPPLY — 58 items
BAG COUNTER SPONGE SURGICOUNT (BAG) ×2 IMPLANT
BIT DRILL 2.2 SS TIBIAL (BIT) IMPLANT
BLADE CLIPPER SURG (BLADE) IMPLANT
BNDG ELASTIC 3INX 5YD STR LF (GAUZE/BANDAGES/DRESSINGS) ×2 IMPLANT
BNDG ELASTIC 4X5.8 VLCR STR LF (GAUZE/BANDAGES/DRESSINGS) ×2 IMPLANT
BNDG ESMARK 4X9 LF (GAUZE/BANDAGES/DRESSINGS) ×2 IMPLANT
BNDG GAUZE DERMACEA FLUFF 4 (GAUZE/BANDAGES/DRESSINGS) ×2 IMPLANT
CORD BIPOLAR FORCEPS 12FT (ELECTRODE) ×2 IMPLANT
COVER SURGICAL LIGHT HANDLE (MISCELLANEOUS) ×2 IMPLANT
CUFF TOURN SGL QUICK 18X4 (TOURNIQUET CUFF) ×2 IMPLANT
CUFF TRNQT CYL 24X4X16.5-23 (TOURNIQUET CUFF) IMPLANT
DRAIN TLS ROUND 10FR (DRAIN) IMPLANT
DRAPE OEC MINIVIEW 54X84 (DRAPES) ×2 IMPLANT
DRAPE SURG 17X11 SM STRL (DRAPES) ×2 IMPLANT
DRSG ADAPTIC 3X8 NADH LF (GAUZE/BANDAGES/DRESSINGS) ×2 IMPLANT
ELECT REM PT RETURN 9FT ADLT (ELECTROSURGICAL)
ELECTRODE REM PT RTRN 9FT ADLT (ELECTROSURGICAL) IMPLANT
GAUZE SPONGE 4X4 12PLY STRL (GAUZE/BANDAGES/DRESSINGS) ×2 IMPLANT
GAUZE XEROFORM 1X8 LF (GAUZE/BANDAGES/DRESSINGS) IMPLANT
GLOVE BIOGEL PI IND STRL 8.5 (GLOVE) ×2 IMPLANT
GLOVE SURG ORTHO 8.0 STRL STRW (GLOVE) ×2 IMPLANT
GOWN STRL REUS W/ TWL LRG LVL3 (GOWN DISPOSABLE) ×6 IMPLANT
GOWN STRL REUS W/ TWL XL LVL3 (GOWN DISPOSABLE) ×2 IMPLANT
K-WIRE FX5X1.6XNS BN SS (WIRE) ×1
KIT BASIN OR (CUSTOM PROCEDURE TRAY) ×2 IMPLANT
KIT TURNOVER KIT B (KITS) ×2 IMPLANT
KWIRE FX5X1.6XNS BN SS (WIRE) IMPLANT
MANIFOLD NEPTUNE II (INSTRUMENTS) ×2 IMPLANT
NDL HYPO 25GX1X1/2 BEV (NEEDLE) IMPLANT
NDL HYPO 25X1 1.5 SAFETY (NEEDLE) ×2 IMPLANT
NEEDLE HYPO 25GX1X1/2 BEV (NEEDLE) ×1 IMPLANT
NEEDLE HYPO 25X1 1.5 SAFETY (NEEDLE) ×1 IMPLANT
NS IRRIG 1000ML POUR BTL (IV SOLUTION) ×2 IMPLANT
PACK ORTHO EXTREMITY (CUSTOM PROCEDURE TRAY) ×2 IMPLANT
PAD ARMBOARD 7.5X6 YLW CONV (MISCELLANEOUS) ×4 IMPLANT
PAD CAST 4YDX4 CTTN HI CHSV (CAST SUPPLIES) ×2 IMPLANT
PEG LOCKING SMOOTH 2.2X20 (Screw) IMPLANT
PEG LOCKING SMOOTH 2.2X22 (Screw) IMPLANT
PLATE STD DVR LEFT (Plate) ×1 IMPLANT
PLATE STD DVR LT 24X55 (Plate) IMPLANT
SCREW LOCK 16X2.7X 3 LD TPR (Screw) IMPLANT
SCREW LOCK 18X2.7X 3 LD TPR (Screw) IMPLANT
SCREW LOCK 24X2.7X3 LD THRD (Screw) IMPLANT
SCREW LOCKING 2.7X15MM (Screw) IMPLANT
SCREW MULTI DIRECTIONAL 2.7X24 (Screw) IMPLANT
SOAP 2 % CHG 4 OZ (WOUND CARE) ×2 IMPLANT
SUT MNCRL AB 3-0 PS2 18 (SUTURE) IMPLANT
SUT PROLENE 3 0 PS 2 (SUTURE) IMPLANT
SUT PROLENE 4 0 PS 2 18 (SUTURE) IMPLANT
SUT VIC AB 2-0 CT1 TAPERPNT 27 (SUTURE) IMPLANT
SUT VIC AB 2-0 FS1 27 (SUTURE) IMPLANT
SUT VICRYL 4-0 PS2 18IN ABS (SUTURE) IMPLANT
SYR CONTROL 10ML LL (SYRINGE) IMPLANT
SYSTEM CHEST DRAIN TLS 7FR (DRAIN) IMPLANT
TOWEL GREEN STERILE (TOWEL DISPOSABLE) ×2 IMPLANT
TOWEL GREEN STERILE FF (TOWEL DISPOSABLE) ×2 IMPLANT
TUBE CONNECTING 12X1/4 (SUCTIONS) ×2 IMPLANT
WATER STERILE IRR 1000ML POUR (IV SOLUTION) ×2 IMPLANT

## 2023-03-22 NOTE — Transfer of Care (Signed)
Immediate Anesthesia Transfer of Care Note  Patient: Kevin Roth  Procedure(s) Performed: OPEN REDUCTION INTERNAL FIXATION (ORIF) WRIST FRACTURE (Left: Wrist)  Patient Location: PACU  Anesthesia Type:Regional  Level of Consciousness: drowsy  Airway & Oxygen Therapy: Patient Spontanous Breathing and Patient connected to face mask oxygen  Post-op Assessment: Report given to RN and Post -op Vital signs reviewed and stable  Post vital signs: Reviewed and stable  Last Vitals:  Vitals Value Taken Time  BP 137/87 03/22/23 1815  Temp 36.1 C 03/22/23 1750  Pulse 61 03/22/23 1820  Resp 14 03/22/23 1820  SpO2 91 % 03/22/23 1820  Vitals shown include unfiled device data.  Last Pain:  Vitals:   03/22/23 1815  TempSrc:   PainSc: 0-No pain      Patients Stated Pain Goal: 0 (03/21/23 0500)  Complications: No notable events documented.

## 2023-03-22 NOTE — Progress Notes (Signed)
R/B/A DISCUSSED WITH PT IN OFFICE.  PT VOICED UNDERSTANDING OF PLAN CONSENT SIGNED DAY OF SURGERY PT SEEN AND EXAMINED PRIOR TO OPERATIVE PROCEDURE/DAY OF SURGERY SITE MARKED. QUESTIONS ANSWERED WILL REMAIN AN INPATIENT FOLLOWING SURGERY   R/B/A DISCUSSED WITH PT IN OFFICE.  PT VOICED UNDERSTANDING OF PLAN CONSENT SIGNED DAY OF SURGERY PT SEEN AND EXAMINED PRIOR TO OPERATIVE PROCEDURE/DAY OF SURGERY SITE MARKED. QUESTIONS ANSWERED WILL GO HOME FOLLOWING SURGERY

## 2023-03-22 NOTE — Progress Notes (Signed)
  CCC Pre-op Review 1.Surgical orders: Consent orders:  Order in [facility] Consent signed: Patient waiting to speak with MD before signing. Patient alert and oriented: Yes Antibiotic:  Ancef OCTOR Pre-meds:  2.  Pre-procedure checklist completed:  Request completion by floor RN  3.  NPO:  MN  4.  CHG Bath completed:  Completed by floor      Belongings removed and placed in clean gown:  Completed by floor  5.  Labs Performed: CBC    Abnormal 03/22/23 CMP/BMP   Abnormal 03/21/23   PT/INR  WNL 03/20/23 HA1C   NA Type and Screen NA Surgical PCR:  Negative 03/20/23 Pregnancy:  NA EKG:    08/09/21 Chest x-ray:  03/19/23   6.  Recent H&P or progress note if inpatient: 03/21/23  7.  Language Barrier:  None  8.  Vital Signs:  Bradycardic Oxygen:  RA Tele:   9.  Medications: Cardiac Drips:  NA Pain Medications: Robaxin 1000mg  PO 0607 Tylenol 1000mg  PO 0607 Beta Blocker:   NA Anticoagulants:  Lovenox  GLP1:   NA  10. IV access:  18 Right FA  11. Diabetic:     NA

## 2023-03-22 NOTE — Anesthesia Preprocedure Evaluation (Addendum)
Anesthesia Evaluation  Patient identified by MRN, date of birth, ID band Patient awake    Reviewed: Allergy & Precautions, H&P , NPO status , Patient's Chart, lab work & pertinent test results  Airway Mallampati: II  TM Distance: >3 FB Neck ROM: Full    Dental no notable dental hx.    Pulmonary neg pulmonary ROS, Current Smoker and Patient abstained from smoking.   Pulmonary exam normal breath sounds clear to auscultation       Cardiovascular negative cardio ROS Normal cardiovascular exam Rhythm:Regular Rate:Normal     Neuro/Psych Seizures -, Well Controlled,  negative neurological ROS  negative psych ROS   GI/Hepatic negative GI ROS, Neg liver ROS,,,  Endo/Other  negative endocrine ROS    Renal/GU negative Renal ROS  negative genitourinary   Musculoskeletal negative musculoskeletal ROS (+)    Abdominal   Peds negative pediatric ROS (+)  Hematology negative hematology ROS (+)   Anesthesia Other Findings   Reproductive/Obstetrics negative OB ROS                             Anesthesia Physical Anesthesia Plan  ASA: 2  Anesthesia Plan: MAC and Regional   Post-op Pain Management: Regional block*   Induction: Intravenous  PONV Risk Score and Plan: 0 and Treatment may vary due to age or medical condition  Airway Management Planned: Simple Face Mask  Additional Equipment:   Intra-op Plan:   Post-operative Plan:   Informed Consent: I have reviewed the patients History and Physical, chart, labs and discussed the procedure including the risks, benefits and alternatives for the proposed anesthesia with the patient or authorized representative who has indicated his/her understanding and acceptance.     Dental advisory given  Plan Discussed with: CRNA  Anesthesia Plan Comments:        Anesthesia Quick Evaluation
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
## 2023-03-22 NOTE — Anesthesia Procedure Notes (Signed)
Anesthesia Regional Block: Supraclavicular block   Pre-Anesthetic Checklist: , timeout performed,  Correct Patient, Correct Site, Correct Laterality,  Correct Procedure, Correct Position, site marked,  Risks and benefits discussed,  Surgical consent,  Pre-op evaluation,  At surgeon's request and post-op pain management  Laterality: Left  Prep: chloraprep       Needles:  Injection technique: Single-shot  Needle Type: Stimiplex     Needle Length: 9cm  Needle Gauge: 21     Additional Needles:   Procedures:,,,, ultrasound used (permanent image in chart),,    Narrative:  Start time: 03/22/2023 4:00 PM End time: 03/22/2023 4:05 PM Injection made incrementally with aspirations every 5 mL.  Performed by: Personally  Anesthesiologist: Lowella Curb, MD
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
## 2023-03-23 ENCOUNTER — Other Ambulatory Visit (HOSPITAL_COMMUNITY): Payer: Self-pay

## 2023-03-23 MED ORDER — ACETAMINOPHEN 500 MG PO TABS: 1000.0000 mg | ORAL_TABLET | Freq: Three times a day (TID) | ORAL | Status: AC | PRN

## 2023-03-23 MED ORDER — POLYETHYLENE GLYCOL 3350 17 G PO PACK: 17.0000 g | PACK | Freq: Every day | ORAL | Status: DC | PRN

## 2023-03-23 MED ORDER — PYRIDOXINE HCL 100 MG PO TABS: 100.0000 mg | ORAL_TABLET | Freq: Every day | ORAL | Status: AC

## 2023-03-23 MED ORDER — DOCUSATE SODIUM 100 MG PO CAPS: 100.0000 mg | ORAL_CAPSULE | Freq: Two times a day (BID) | ORAL | Status: DC | PRN

## 2023-03-23 MED ORDER — OXYCODONE HCL 5 MG PO TABS
5.0000 mg | ORAL_TABLET | Freq: Four times a day (QID) | ORAL | 0 refills | Status: DC | PRN
  Filled 2023-03-23: qty 20, 3d supply, fill #0

## 2023-03-23 NOTE — Plan of Care (Signed)
  Problem: Education: Goal: Knowledge of General Education information will improve Description: Including pain rating scale, medication(s)/side effects and non-pharmacologic comfort measures Outcome: Progressing   Problem: Health Behavior/Discharge Planning: Goal: Ability to manage health-related needs will improve Outcome: Progressing   Problem: Clinical Measurements: Goal: Ability to maintain clinical measurements within normal limits will improve Outcome: Progressing Goal: Will remain free from infection Outcome: Progressing Goal: Diagnostic test results will improve Outcome: Progressing Goal: Respiratory complications will improve Outcome: Progressing Goal: Cardiovascular complication will be avoided Outcome: Progressing   Problem: Activity: Goal: Risk for activity intolerance will decrease Outcome: Progressing   Problem: Nutrition: Goal: Adequate nutrition will be maintained Outcome: Progressing   Problem: Coping: Goal: Level of anxiety will decrease Outcome: Progressing   Problem: Pain Management: Goal: General experience of comfort will improve Outcome: Progressing   Problem: Safety: Goal: Ability to remain free from injury will improve Outcome: Progressing   Problem: Skin Integrity: Goal: Risk for impaired skin integrity will decrease Outcome: Progressing
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
## 2023-03-23 NOTE — Patient Instructions (Addendum)
Radial and Ulnar ORIF Postoperative Instructions  General: A family member or caregiver may need to assist you for the next few weeks.  Helps with basic movement and function  Helps with household chores  Helps ensure all medications are administered  Helps provide emotional support for daily life and rehabilitation  Furniture may need to be rearranged to avoid tripping and falling. In general, the more you are up and ambulating, the better you will do. You may ice once every hour for up to 20 minutes, as needed, in the first few postoperative days. If you had a nerve block, the effects may last 24 hours. If pain becomes excessive or if you develop numbness or tingling, please call the office.  Activity: Non-weightbearing to the LUE. No push up to stand using Left arm/hand.  Move your fingers throughout the day to prevent swelling, increase circulation, and decrease pain.  Elevate your Left arm above your heart when possible to help decrease pain and swelling.   HAND PUMPS  Hold your hand up as shown. Open and close your hand into a fist and repeat. If you cannot make a full fist, then make a partial fist. This can help with reducing swelling and stiffness.  Complete 10 hand pumps.   Showering/Bathing:  Do not get post op splint wet. Keep Left arm dry by using a clean trash bag taped to prevent splint from getting wet. Have a caregiver assist you when bathing.   Follow up appointment with Surgeon in 10-14 days. Call their office if you have any questions.
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
## 2023-03-23 NOTE — Anesthesia Postprocedure Evaluation (Signed)
Anesthesia Post Note  Patient: [place]  Procedure(s) Performed: OPEN REDUCTION INTERNAL FIXATION (ORIF) WRIST FRACTURE (Left: Wrist)     Patient location during evaluation: PACU Anesthesia Type: Regional Level of consciousness: awake and alert Pain management: pain level controlled Vital Signs Assessment: post-procedure vital signs reviewed and stable Respiratory status: spontaneous breathing, nonlabored ventilation and respiratory function stable Cardiovascular status: blood pressure returned to baseline and stable Postop Assessment: no apparent nausea or vomiting Anesthetic complications: no   No notable events documented.  Last Vitals:  Vitals:   03/23/23 0747 03/23/23 1200  BP: (!) 134/59 129/74  Pulse: (!) 57 65  Resp: 15 18  Temp: 37.1 C 37.1 C  SpO2: 95% 96%    Last Pain:  Vitals:   03/23/23 1615  TempSrc:   PainSc: 7    Pain Goal: Patients Stated Pain Goal: 0 (03/21/23 0500)                 Lowella Curb
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
## 2023-03-23 NOTE — Plan of Care (Signed)
  Problem: Education: Goal: Knowledge of General Education information will improve Description: Including pain rating scale, medication(s)/side effects and non-pharmacologic comfort measures 03/23/2023 1623 by Dannielle Burn, RN Outcome: Adequate for Discharge 03/23/2023 1307 by Dannielle Burn, RN Outcome: Progressing   Problem: Health Behavior/Discharge Planning: Goal: Ability to manage health-related needs will improve 03/23/2023 1623 by Dannielle Burn, RN Outcome: Adequate for Discharge 03/23/2023 1307 by Dannielle Burn, RN Outcome: Progressing   Problem: Clinical Measurements: Goal: Ability to maintain clinical measurements within normal limits will improve 03/23/2023 1623 by Dannielle Burn, RN Outcome: Adequate for Discharge 03/23/2023 1307 by Dannielle Burn, RN Outcome: Progressing Goal: Will remain free from infection 03/23/2023 1623 by Dannielle Burn, RN Outcome: Adequate for Discharge 03/23/2023 1307 by Dannielle Burn, RN Outcome: Progressing Goal: Diagnostic test results will improve 03/23/2023 1623 by Dannielle Burn, RN Outcome: Adequate for Discharge 03/23/2023 1307 by Dannielle Burn, RN Outcome: Progressing Goal: Respiratory complications will improve 03/23/2023 1623 by Dannielle Burn, RN Outcome: Adequate for Discharge 03/23/2023 1307 by Dannielle Burn, RN Outcome: Progressing Goal: Cardiovascular complication will be avoided 03/23/2023 1623 by Dannielle Burn, RN Outcome: Adequate for Discharge 03/23/2023 1307 by Dannielle Burn, RN Outcome: Progressing   Problem: Activity: Goal: Risk for activity intolerance will decrease 03/23/2023 1623 by Dannielle Burn, RN Outcome: Adequate for Discharge 03/23/2023 1307 by Dannielle Burn, RN Outcome: Progressing   Problem: Nutrition: Goal: Adequate nutrition will be maintained 03/23/2023 1623 by Dannielle Burn, RN Outcome: Adequate for Discharge 03/23/2023 1307 by Dannielle Burn, RN Outcome:  Progressing   Problem: Coping: Goal: Level of anxiety will decrease 03/23/2023 1623 by Dannielle Burn, RN Outcome: Adequate for Discharge 03/23/2023 1307 by Dannielle Burn, RN Outcome: Progressing   Problem: Pain Management: Goal: General experience of comfort will improve 03/23/2023 1623 by Dannielle Burn, RN Outcome: Adequate for Discharge 03/23/2023 1307 by Dannielle Burn, RN Outcome: Progressing   Problem: Safety: Goal: Ability to remain free from injury will improve 03/23/2023 1623 by Dannielle Burn, RN Outcome: Adequate for Discharge 03/23/2023 1307 by Dannielle Burn, RN Outcome: Progressing   Problem: Skin Integrity: Goal: Risk for impaired skin integrity will decrease 03/23/2023 1623 by Dannielle Burn, RN Outcome: Adequate for Discharge 03/23/2023 1307 by Dannielle Burn, RN Outcome: Progressing
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
## 2023-03-24 ENCOUNTER — Encounter (HOSPITAL_COMMUNITY): Payer: Self-pay | Admitting: Orthopedic Surgery

## 2023-03-27 ENCOUNTER — Encounter: Payer: Self-pay | Admitting: Neurology

## 2023-03-27 ENCOUNTER — Ambulatory Visit: Payer: BC Managed Care – PPO | Admitting: Neurology

## 2023-03-27 VITALS — BP 125/81 | HR 92 | Ht 71.0 in | Wt 191.6 lb

## 2023-03-27 DIAGNOSIS — G40009 Localization-related (focal) (partial) idiopathic epilepsy and epileptic syndromes with seizures of localized onset, not intractable, without status epilepticus: Secondary | ICD-10-CM

## 2023-03-27 DIAGNOSIS — F32A Depression, unspecified: Secondary | ICD-10-CM | POA: Diagnosis not present

## 2023-03-27 MED ORDER — LEVETIRACETAM 1000 MG PO TABS: ORAL_TABLET | ORAL | 3 refills | Status: DC
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
## 2023-03-29 DIAGNOSIS — G40909 Epilepsy, unspecified, not intractable, without status epilepticus: Secondary | ICD-10-CM | POA: Diagnosis not present

## 2023-03-29 DIAGNOSIS — Z720 Tobacco use: Secondary | ICD-10-CM | POA: Diagnosis not present

## 2023-03-29 DIAGNOSIS — S0219XD Other fracture of base of skull, subsequent encounter for fracture with routine healing: Secondary | ICD-10-CM | POA: Diagnosis not present

## 2023-03-29 DIAGNOSIS — S0285XD Fracture of orbit, unspecified, subsequent encounter for fracture with routine healing: Secondary | ICD-10-CM | POA: Diagnosis not present

## 2023-03-30 DIAGNOSIS — S6292XD Unspecified fracture of left wrist and hand, subsequent encounter for fracture with routine healing: Secondary | ICD-10-CM | POA: Diagnosis not present

## 2023-04-05 ENCOUNTER — Telehealth: Payer: Self-pay | Admitting: Neurology

## 2023-04-05 NOTE — Telephone Encounter (Signed)
Eric with the social security law firm wants to speak with Dr.Aquino about a claim. He called and left a message last week, and he needs a quick telephone conference

## 2023-04-05 NOTE — Telephone Encounter (Signed)
Called [facility] [place] office @ [phone]. Attorney requesting to speak with Dr. Karel Jarvis regarding the patient's upcoming disability hearing. Attorney needs to understand the affects of taking a high dose of epilepsy medication would have on the patient. I requested that attorney fax our office a letter with their request along with a copy of the HIPAA release form from the patient giving Dr. Karel Jarvis permission to speak with attorney regarding patients medications.
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
## 2023-04-07 ENCOUNTER — Encounter: Payer: Self-pay | Admitting: Physical Therapy

## 2023-04-07 ENCOUNTER — Ambulatory Visit: Payer: BC Managed Care – PPO | Attending: Physician Assistant | Admitting: Speech Pathology

## 2023-04-07 ENCOUNTER — Ambulatory Visit: Payer: BC Managed Care – PPO | Admitting: Occupational Therapy

## 2023-04-07 ENCOUNTER — Ambulatory Visit: Payer: BC Managed Care – PPO | Admitting: Physical Therapy

## 2023-04-07 ENCOUNTER — Encounter: Payer: Self-pay | Admitting: Speech Pathology

## 2023-04-07 ENCOUNTER — Other Ambulatory Visit: Payer: Self-pay

## 2023-04-07 DIAGNOSIS — R29898 Other symptoms and signs involving the musculoskeletal system: Secondary | ICD-10-CM | POA: Insufficient documentation

## 2023-04-07 DIAGNOSIS — H8111 Benign paroxysmal vertigo, right ear: Secondary | ICD-10-CM

## 2023-04-07 DIAGNOSIS — R41841 Cognitive communication deficit: Secondary | ICD-10-CM | POA: Insufficient documentation

## 2023-04-07 DIAGNOSIS — S0291XA Unspecified fracture of skull, initial encounter for closed fracture: Secondary | ICD-10-CM | POA: Insufficient documentation
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
## 2023-04-14 ENCOUNTER — Encounter: Payer: Self-pay | Admitting: Neurology

## 2023-04-20 DIAGNOSIS — S0291XD Unspecified fracture of skull, subsequent encounter for fracture with routine healing: Secondary | ICD-10-CM | POA: Diagnosis not present

## 2023-04-25 ENCOUNTER — Telehealth: Payer: Self-pay | Admitting: Neurology

## 2023-04-25 DIAGNOSIS — S6292XA Unspecified fracture of left wrist and hand, initial encounter for closed fracture: Secondary | ICD-10-CM | POA: Diagnosis not present

## 2023-04-25 DIAGNOSIS — M25632 Stiffness of left wrist, not elsewhere classified: Secondary | ICD-10-CM | POA: Diagnosis not present

## 2023-04-25 NOTE — Telephone Encounter (Signed)
 Pt and his mother came in wanting to see if Dr. Karel Jarvis could send a letter to the insurance commission? 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

## 2023-05-02 DIAGNOSIS — M25632 Stiffness of left wrist, not elsewhere classified: Secondary | ICD-10-CM | POA: Diagnosis not present

## 2023-05-04 ENCOUNTER — Encounter: Payer: Self-pay | Admitting: Neurology

## 2023-05-04 NOTE — Telephone Encounter (Signed)
Done, thanks

## 2023-05-04 NOTE — Telephone Encounter (Signed)
Letter faxed to [phone] to [facility] department of insurance

## 2023-05-09 DIAGNOSIS — M25632 Stiffness of left wrist, not elsewhere classified: Secondary | ICD-10-CM | POA: Diagnosis not present

## 2023-05-16 DIAGNOSIS — M25632 Stiffness of left wrist, not elsewhere classified: Secondary | ICD-10-CM | POA: Diagnosis not present

## 2023-05-23 DIAGNOSIS — M25632 Stiffness of left wrist, not elsewhere classified: Secondary | ICD-10-CM | POA: Diagnosis not present

## 2023-05-23 DIAGNOSIS — S6292XD Unspecified fracture of left wrist and hand, subsequent encounter for fracture with routine healing: Secondary | ICD-10-CM | POA: Diagnosis not present

## 2023-05-30 DIAGNOSIS — E7800 Pure hypercholesterolemia, unspecified: Secondary | ICD-10-CM | POA: Diagnosis not present

## 2023-06-01 DIAGNOSIS — M25632 Stiffness of left wrist, not elsewhere classified: Secondary | ICD-10-CM | POA: Diagnosis not present

## 2023-06-08 DIAGNOSIS — M25632 Stiffness of left wrist, not elsewhere classified: Secondary | ICD-10-CM | POA: Diagnosis not present

## 2023-06-15 DIAGNOSIS — M25632 Stiffness of left wrist, not elsewhere classified: Secondary | ICD-10-CM | POA: Diagnosis not present

## 2023-06-20 DIAGNOSIS — M25632 Stiffness of left wrist, not elsewhere classified: Secondary | ICD-10-CM | POA: Diagnosis not present

## 2023-06-20 DIAGNOSIS — S6292XD Unspecified fracture of left wrist and hand, subsequent encounter for fracture with routine healing: Secondary | ICD-10-CM | POA: Diagnosis not present

## 2023-06-27 DIAGNOSIS — S0219XD Other fracture of base of skull, subsequent encounter for fracture with routine healing: Secondary | ICD-10-CM | POA: Diagnosis not present

## 2023-06-27 DIAGNOSIS — G40909 Epilepsy, unspecified, not intractable, without status epilepticus: Secondary | ICD-10-CM | POA: Diagnosis not present

## 2023-06-27 DIAGNOSIS — S0219XA Other fracture of base of skull, initial encounter for closed fracture: Secondary | ICD-10-CM | POA: Diagnosis not present

## 2023-06-27 DIAGNOSIS — J341 Cyst and mucocele of nose and nasal sinus: Secondary | ICD-10-CM | POA: Diagnosis not present

## 2023-06-27 DIAGNOSIS — S0285XD Fracture of orbit, unspecified, subsequent encounter for fracture with routine healing: Secondary | ICD-10-CM | POA: Diagnosis not present

## 2023-07-05 ENCOUNTER — Ambulatory Visit: Payer: BC Managed Care – PPO | Admitting: Neurology

## 2023-07-05 ENCOUNTER — Encounter: Payer: Self-pay | Admitting: Neurology

## 2023-07-05 VITALS — BP 112/77 | HR 80 | Ht 73.0 in | Wt 208.6 lb

## 2023-07-05 DIAGNOSIS — G40009 Localization-related (focal) (partial) idiopathic epilepsy and epileptic syndromes with seizures of localized onset, not intractable, without status epilepticus: Secondary | ICD-10-CM

## 2023-07-05 MED ORDER — LEVETIRACETAM 1000 MG PO TABS: ORAL_TABLET | ORAL | 3 refills | Status: DC
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
## 2023-07-11 DIAGNOSIS — Z029 Encounter for administrative examinations, unspecified: Secondary | ICD-10-CM

## 2023-08-02 ENCOUNTER — Telehealth: Payer: Self-pay | Admitting: Neurology

## 2023-08-02 ENCOUNTER — Encounter (HOSPITAL_COMMUNITY): Payer: Self-pay | Admitting: Emergency Medicine

## 2023-08-02 ENCOUNTER — Other Ambulatory Visit: Payer: Self-pay

## 2023-08-02 ENCOUNTER — Emergency Department (HOSPITAL_COMMUNITY)
Admission: EM | Admit: 2023-08-02 | Discharge: 2023-08-02 | Disposition: A | Discharge status: Home / Self Care | Attending: Emergency Medicine | Admitting: Emergency Medicine

## 2023-08-02 DIAGNOSIS — R569 Unspecified convulsions: Secondary | ICD-10-CM | POA: Diagnosis not present

## 2023-08-02 DIAGNOSIS — E162 Hypoglycemia, unspecified: Secondary | ICD-10-CM | POA: Diagnosis not present

## 2023-08-02 DIAGNOSIS — G40909 Epilepsy, unspecified, not intractable, without status epilepticus: Secondary | ICD-10-CM | POA: Insufficient documentation

## 2023-08-02 DIAGNOSIS — Y900 Blood alcohol level of less than 20 mg/100 ml: Secondary | ICD-10-CM | POA: Diagnosis not present

## 2023-08-02 DIAGNOSIS — F1210 Cannabis abuse, uncomplicated: Secondary | ICD-10-CM | POA: Diagnosis not present

## 2023-08-02 LAB — CBC WITH DIFFERENTIAL/PLATELET
Abs Immature Granulocytes: 0.03 10*3/uL (ref 0.00–0.07)
Basophils Absolute: 0.1 10*3/uL (ref 0.0–0.1)
Basophils Relative: 1 %
Eosinophils Absolute: 0.5 10*3/uL (ref 0.0–0.5)
Eosinophils Relative: 5 %
HCT: 43.5 % (ref 39.0–52.0)
Hemoglobin: 14.9 g/dL (ref 13.0–17.0)
Immature Granulocytes: 0 %
Lymphocytes Relative: 21 %
Lymphs Abs: 1.9 10*3/uL (ref 0.7–4.0)
MCH: 32.3 pg (ref 26.0–34.0)
MCHC: 34.3 g/dL (ref 30.0–36.0)
MCV: 94.4 fL (ref 80.0–100.0)
Monocytes Absolute: 0.4 10*3/uL (ref 0.1–1.0)
Monocytes Relative: 5 %
Neutro Abs: 6.2 10*3/uL (ref 1.7–7.7)
Neutrophils Relative %: 68 %
Platelets: 179 10*3/uL (ref 150–400)
RBC: 4.61 MIL/uL (ref 4.22–5.81)
RDW: 11.8 % (ref 11.5–15.5)
WBC: 9 10*3/uL (ref 4.0–10.5)
nRBC: 0 % (ref 0.0–0.2)

## 2023-08-02 LAB — COMPREHENSIVE METABOLIC PANEL WITH GFR
ALT: 15 U/L (ref 0–44)
AST: 22 U/L (ref 15–41)
Albumin: 3.7 g/dL (ref 3.5–5.0)
Alkaline Phosphatase: 50 U/L (ref 38–126)
Anion gap: 11 (ref 5–15)
BUN: 8 mg/dL (ref 6–20)
CO2: 24 mmol/L (ref 22–32)
Calcium: 8.9 mg/dL (ref 8.9–10.3)
Chloride: 104 mmol/L (ref 98–111)
Creatinine, Ser: 1.19 mg/dL (ref 0.61–1.24)
GFR, Estimated: >60 mL/min (ref >60)
Glucose, Bld: 88 mg/dL (ref 70–99)
Potassium: 4.1 mmol/L (ref 3.5–5.1)
Sodium: 139 mmol/L (ref 135–145)
Total Bilirubin: 0.2 mg/dL (ref 0.0–1.2)
Total Protein: 6.1 g/dL — ABNORMAL LOW (ref 6.5–8.1)

## 2023-08-02 LAB — RAPID URINE DRUG SCREEN, HOSP PERFORMED
Amphetamines: NOT DETECTED
Barbiturates: NOT DETECTED
Benzodiazepines: NOT DETECTED
Cocaine: NOT DETECTED
Opiates: NOT DETECTED
Tetrahydrocannabinol: POSITIVE — AB

## 2023-08-02 LAB — ETHANOL: Alcohol, Ethyl (B): <10 mg/dL (ref <10)

## 2023-08-02 LAB — CBG MONITORING, ED: Glucose-Capillary: 84 mg/dL (ref 70–99)

## 2023-08-02 NOTE — Discharge Instructions (Signed)
 Continue your current medications.  Follow-up with Dr. Ty Gales to discuss your recurrent seizure episode.  We did send off a Keppra level while you are in the emergency room.  That result should be available for to review with Dr. Ty Gales.  Return to the ED for any recurrent episodes or other concerns

## 2023-08-02 NOTE — Telephone Encounter (Signed)
 Caller left voicemail stating patient had a seizure this morning and to follow up with Dr Ty Gales
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
## 2023-08-02 NOTE — ED Triage Notes (Signed)
 Pt BIB [facility] due to witnessed seizure by mother at home.  PT normally takes 4,00 keppra twice a day.  Pt did take evening dose.   VS CBG 55 EMS gave 15 grams instant glucose CBG now 88.

## 2023-08-03 LAB — LEVETIRACETAM LEVEL: Levetiracetam Lvl: 40.7 ug/mL — ABNORMAL HIGH (ref 10.0–40.0)

## 2023-08-03 NOTE — Telephone Encounter (Signed)
 Spoke with pt mother informed her that Dr Ty Gales stated that Keppra level is pending, depending on level, we will discuss next steps,

## 2023-08-03 NOTE — Telephone Encounter (Signed)
 Pls let him know that Keppra level is pending, depending on level, we will discuss next steps, thanks

## 2023-08-09 ENCOUNTER — Telehealth: Payer: Self-pay | Admitting: Neurology

## 2023-08-09 NOTE — Telephone Encounter (Signed)
 Left message with the after hour service 08-09-23 at 12:22 pm  Caller states patient had a seizure last wed 08-02-23 and went to [facility]. They ran test and told her to follow up with our office to see if he needed to be seen by Dr Ty Gales please call

## 2023-08-09 NOTE — Telephone Encounter (Signed)
 Pt mother called informed that keppra  levels are back but Dr Ty Gales is out of the office when she returns next week we will call them with what Dr Ty Gales wants to do,

## 2023-08-16 NOTE — Telephone Encounter (Signed)
 Scheduled for 08/25/23 for f/u

## 2023-08-25 ENCOUNTER — Encounter: Payer: Self-pay | Admitting: Neurology

## 2023-08-25 ENCOUNTER — Ambulatory Visit: Admitting: Neurology

## 2023-08-25 ENCOUNTER — Other Ambulatory Visit: Payer: Self-pay | Admitting: Neurology

## 2023-08-25 VITALS — BP 108/64 | HR 65 | Ht 73.0 in | Wt 207.2 lb

## 2023-08-25 DIAGNOSIS — G40009 Localization-related (focal) (partial) idiopathic epilepsy and epileptic syndromes with seizures of localized onset, not intractable, without status epilepticus: Secondary | ICD-10-CM | POA: Diagnosis not present

## 2023-08-25 MED ORDER — LEVETIRACETAM 1000 MG PO TABS: ORAL_TABLET | ORAL | 3 refills | Status: DC

## 2023-08-25 MED ORDER — LAMOTRIGINE 25 MG PO TABS: 25.0000 mg | ORAL_TABLET | Freq: Two times a day (BID) | ORAL | 3 refills | Status: DC

## 2023-08-25 MED ORDER — NAYZILAM 5 MG/0.1ML NA SOLN: NASAL | 5 refills | Status: AC

## 2023-08-25 NOTE — Patient Instructions (Addendum)
 Good to see you.  Start Lamotrigine  25mg : Take 1 tablet twice a day  2. Continue Levetiracetam  1000mg : take 2 tablets twice a day  3. Refills sent for Nayzilam  spray as needed for seizure rescue  4. Follow-up in 3 months, call for any changes   Seizure Precautions: 1. If medication has been prescribed for you to prevent seizures, take it exactly as directed.  Do not stop taking the medicine without talking to your doctor first, even if you have not had a seizure in a long time.   2. Avoid activities in which a seizure would cause danger to yourself or to others.  Don't operate dangerous machinery, swim alone, or climb in high or dangerous places, such as on ladders, roofs, or girders.  Do not drive unless your doctor says you may.  3. If you have any warning that you may have a seizure, lay down in a safe place where you can't hurt yourself.    4.  No driving for 6 months from last seizure, as per [place]  state law.   Please refer to the following link on the Epilepsy Foundation of America's website for more information: http://www.epilepsyfoundation.org/answerplace/Social/driving/drivingu.cfm   5.  Maintain good sleep hygiene. Avoid alcohol.  6.  Contact your doctor if you have any problems that may be related to the medicine you are taking.  7.  Call 911 and bring the patient back to the ED if:        A.  The seizure lasts longer than 5 minutes.       B.  The patient doesn't awaken shortly after the seizure  C.  The patient has new problems such as difficulty seeing, speaking or moving  D.  The patient was injured during the seizure  E.  The patient has a temperature over 102 F (39C)  F.  The patient vomited and now is having trouble breathing
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
## 2023-08-30 ENCOUNTER — Telehealth: Payer: Self-pay | Admitting: Neurology

## 2023-08-30 NOTE — Telephone Encounter (Signed)
 Pt needs new script describing dosage and how many a day and night or week sent to be filled

## 2023-08-30 NOTE — Telephone Encounter (Signed)
 Pt's mother called in stating that [facility] in [place] won't fill the lamotrigine  due to the frequency. They need to consult with her on why it isn't being given once a day.

## 2023-08-30 NOTE — Telephone Encounter (Signed)
 Pls let mother know that I have always started Lamotrigine  25mg  twice a day, but recently pharmacists have been overriding and asking for change. If it will help with dispensing, mother can let pharmacist know that he has been instructed to take 1 tablet every night for 1 week, then increase to 1 tablet twice a day. Thanks

## 2023-08-30 NOTE — Telephone Encounter (Signed)
 Pt mother called an informed Dr Ty Gales has always started Lamotrigine  25mg  twice a day, but recently pharmacists have been overriding and asking for change. If it will help with dispensing, mother can let pharmacist know that he has been instructed to take 1 tablet every night for 1 week, then increase to 1 tablet twice a day.

## 2023-08-31 ENCOUNTER — Other Ambulatory Visit: Payer: Self-pay

## 2023-08-31 ENCOUNTER — Emergency Department (HOSPITAL_COMMUNITY)
Admission: EM | Admit: 2023-08-31 | Discharge: 2023-08-31 | Disposition: A | Discharge status: Home / Self Care | Attending: Emergency Medicine | Admitting: Emergency Medicine

## 2023-08-31 DIAGNOSIS — R569 Unspecified convulsions: Secondary | ICD-10-CM | POA: Insufficient documentation

## 2023-08-31 DIAGNOSIS — R9431 Abnormal electrocardiogram [ECG] [EKG]: Secondary | ICD-10-CM | POA: Diagnosis not present

## 2023-08-31 DIAGNOSIS — D72829 Elevated white blood cell count, unspecified: Secondary | ICD-10-CM | POA: Diagnosis not present

## 2023-08-31 LAB — CBC WITH DIFFERENTIAL/PLATELET
Abs Immature Granulocytes: 0.03 10*3/uL (ref 0.00–0.07)
Basophils Absolute: 0.1 10*3/uL (ref 0.0–0.1)
Basophils Relative: 1 %
Eosinophils Absolute: 0.4 10*3/uL (ref 0.0–0.5)
Eosinophils Relative: 4 %
HCT: 43.9 % (ref 39.0–52.0)
Hemoglobin: 14.8 g/dL (ref 13.0–17.0)
Immature Granulocytes: 0 %
Lymphocytes Relative: 23 %
Lymphs Abs: 2.4 10*3/uL (ref 0.7–4.0)
MCH: 32 pg (ref 26.0–34.0)
MCHC: 33.7 g/dL (ref 30.0–36.0)
MCV: 95 fL (ref 80.0–100.0)
Monocytes Absolute: 0.6 10*3/uL (ref 0.1–1.0)
Monocytes Relative: 5 %
Neutro Abs: 7.1 10*3/uL (ref 1.7–7.7)
Neutrophils Relative %: 67 %
Platelets: 209 10*3/uL (ref 150–400)
RBC: 4.62 MIL/uL (ref 4.22–5.81)
RDW: 11.9 % (ref 11.5–15.5)
WBC: 10.6 10*3/uL — ABNORMAL HIGH (ref 4.0–10.5)
nRBC: 0 % (ref 0.0–0.2)

## 2023-08-31 LAB — CBG MONITORING, ED: Glucose-Capillary: 82 mg/dL (ref 70–99)

## 2023-08-31 LAB — COMPREHENSIVE METABOLIC PANEL WITH GFR
ALT: 14 U/L (ref 0–44)
AST: 21 U/L (ref 15–41)
Albumin: 3.6 g/dL (ref 3.5–5.0)
Alkaline Phosphatase: 47 U/L (ref 38–126)
Anion gap: 5 (ref 5–15)
BUN: 7 mg/dL (ref 6–20)
CO2: 27 mmol/L (ref 22–32)
Calcium: 8.6 mg/dL — ABNORMAL LOW (ref 8.9–10.3)
Chloride: 107 mmol/L (ref 98–111)
Creatinine, Ser: 1.09 mg/dL (ref 0.61–1.24)
GFR, Estimated: >60 mL/min (ref >60)
Glucose, Bld: 92 mg/dL (ref 70–99)
Potassium: 3.6 mmol/L (ref 3.5–5.1)
Sodium: 139 mmol/L (ref 135–145)
Total Bilirubin: 0.7 mg/dL (ref 0.0–1.2)
Total Protein: 6.3 g/dL — ABNORMAL LOW (ref 6.5–8.1)

## 2023-08-31 MED ORDER — LEVETIRACETAM (KEPPRA) 500 MG/5 ML ADULT IV PUSH
1000.0000 mg | Freq: Once | INTRAVENOUS | Status: AC
  Administered 2023-08-31: 1000 mg via INTRAVENOUS
  Filled 2023-08-31: qty 10

## 2023-08-31 MED ORDER — LAMOTRIGINE 25 MG PO TABS: 25.0000 mg | ORAL_TABLET | Freq: Two times a day (BID) | ORAL | 3 refills | Status: DC

## 2023-08-31 MED ORDER — LAMOTRIGINE 25 MG PO TABS
25.0000 mg | ORAL_TABLET | Freq: Once | ORAL | Status: AC
  Administered 2023-08-31: 25 mg via ORAL
  Filled 2023-08-31: qty 1

## 2023-08-31 NOTE — Plan of Care (Signed)
 On call neurology note  Breakthrough seizure. Now back to baseline. On Keppra  2g BID Also supposed to be on Lamotrigine . Likely not taking. Recommend load with Keppra  and start Lamotrigine  as prescribed by Dr. Bennie Brave. Follow up with Dr. Ty Gales.  Tona Francis, MD Neurology

## 2023-08-31 NOTE — ED Notes (Signed)
 Discharge paper work revived with pt no questions or new onset distress at this time. Pt is leaving with mother who will drive him.

## 2023-08-31 NOTE — Discharge Instructions (Signed)
 Evaluation today revealed that you likely to have a seizure at home.  Neurology recommended adding Lamictal  twice a day along with your Keppra .  Please do not drive for the next 6 months and recommend against swimming.  Please follow-up with your neurologist and PCP.  If you have another seizure, chest pain shortness of breath headache or any other concerning symptom please return to the ED for further evaluation.

## 2023-08-31 NOTE — ED Notes (Signed)
 Seizure pads applied to bed.

## 2023-08-31 NOTE — ED Triage Notes (Signed)
 According to [employer]: Family witness pt having seizure 2 minutes with 15 minute postictal period. Pt has a history of seizures with one occurring last month, previously only as a child. Pt has had a recent head injury last year. All vitals normal.  Vitals: 118/70 Hr 72 Spo2 96 Cbg 111

## 2023-08-31 NOTE — ED Provider Notes (Signed)
 [facility] AT [facility] Provider Note   CSN: [id number] Arrival date & time: 08/31/23  4098     History  No chief complaint on file.  HPI Kevin Roth is a 38 y.o. male with history of seizures presenting for seizure today.  States he believes the seizure may have occurred while sleeping this morning. He woke up and EMS was in his room. Seizure was witnessed by his mother. Family witnesses say that he had a seizure lasted for about 2 minutes with a 15-minute postictal period. He also endorses biting his tongue. States he is compliant taking his Keppra  but is not taking the "max dose".  HPI     Home Medications Prior to Admission medications   Medication Sig Start Date End Date Taking? Authorizing Provider  acetaminophen  (TYLENOL ) 500 MG tablet Take 2 tablets (1,000 mg total) by mouth every 8 (eight) hours as needed. 03/23/23   Maczis, Michael M, PA-C  lamoTRIgine  (LAMICTAL ) 25 MG tablet Take 1 tablet (25 mg total) by mouth 2 (two) times daily. Take 1 tablet every night for 1 week, then increase to 1 tablet twice a day 08/31/23   Jhonny Moss, MD  levETIRAcetam  (KEPPRA ) 1000 MG tablet Take 2 tablets two times daily 08/25/23   Jhonny Moss, MD  Midazolam  (NAYZILAM ) 5 MG/0.1ML SOLN Spray one dose in one nostril as needed for seizure. If seizure continues, may give second dose after 10 minutes if needed. 08/25/23   Jhonny Moss, MD  pyridOXINE  (B-6) 100 MG tablet Take 1 tablet (100 mg total) by mouth daily. 03/24/23   Maczis, Michael M, PA-C      Allergies    Patient has no known allergies.    Review of Systems   See HPI   Physical Exam   Vitals:   08/31/23 1300 08/31/23 1305  BP:  117/71  Pulse: (!) 50 61  Resp: 16 (!) 22  Temp:    SpO2: 98% 99%    CONSTITUTIONAL:  well-appearing, NAD NEURO:  GCS 15. Speech is goal oriented. No deficits appreciated to CN III-XII; symmetric eyebrow raise, no facial drooping, tongue midline. Patient has  equal grip strength bilaterally with 5/5 strength against resistance in all major muscle groups bilaterally. Sensation to light touch intact. Patient moves extremities without ataxia. Normal finger-nose-finger. Patient ambulatory with steady gait. EYES:  eyes equal and reactive ENT/NECK:  Supple, no stridor, tongue biting noted CARDIO:  regular rate and rhythm, appears well-perfused  PULM:  No respiratory distress, CTAB GI/GU:  non-distended, soft MSK/SPINE:  No gross deformities, no edema, moves all extremities  SKIN:  no rash, atraumatic  *Additional and/or pertinent findings included in MDM below   ED Results / Procedures / Treatments   Labs (all labs ordered are listed, but only abnormal results are displayed) Labs Reviewed  COMPREHENSIVE METABOLIC PANEL WITH GFR - Abnormal; Notable for the following components:      Result Value   Calcium 8.6 (*)    Total Protein 6.3 (*)    All other components within normal limits  CBC WITH DIFFERENTIAL/PLATELET - Abnormal; Notable for the following components:   WBC 10.6 (*)    All other components within normal limits  CBC WITH DIFFERENTIAL/PLATELET  LEVETIRACETAM  LEVEL  CBG MONITORING, ED    EKG EKG Interpretation Date/Time:  Thursday Aug 31 2023 07:56:49 EDT Ventricular Rate:  69 PR Interval:  126 QRS Duration:  92 QT Interval:  397 QTC Calculation: 426 R Axis:  47  Text Interpretation: Sinus arrhythmia when compared to prior, overall similar appearance No STEMI Confirmed by Wynell Heath ([id number]) on 08/31/2023 12:23:59 PM  Radiology No results found.  Procedures Procedures    Medications Ordered in ED Medications  levETIRAcetam  (KEPPRA ) undiluted injection 1,000 mg (1,000 mg Intravenous Given 08/31/23 0926)  lamoTRIgine  (LAMICTAL ) tablet 25 mg (25 mg Oral Given 08/31/23 2956)    ED Course/ Medical Decision Making/ A&P Clinical Course as of 08/31/23 1455  Thu Aug 31, 2023  0906 Discussed patient with Arora, advised  that he start Lamictal  twice daily as was planned by his neurologist.  Otherwise is safe for discharge after observation and no witnessed seizure or seizure-like activity during encounter. [JR]    Clinical Course User Index [JR] Janalee Mcmurray, PA-C                                 Medical Decision Making Amount and/or Complexity of Data Reviewed Labs: ordered.  Risk Prescription drug management.   Initial Impression and Ddx 38 well appearing male present for seizures.  Exam notable for tongue biting but otherwise reassuring.  Does not appear to be postictal at this time.  DDx includes breakthrough seizure, electrolyte derangement, arrhythmia, meningitis, SAH, other.  Patient PMH that increases complexity of ED encounter: History of seizures  Interpretation of Diagnostics - I independent reviewed and interpreted the labs as followed: mild leukocytosis  - I personally reviewed and interpreted EKG which revealed sinus arrhythmia with similar to prior and no evidence of ischemia  Patient Reassessment and Ultimate Disposition/Management Likely did have a seizure at home but no witnessed seizure or seizure-like activity here does not postictal and appears to be at his normal baseline of mentation.  Discussed patient with Dr. Arora, advised that he start Lamictal  twice daily as was planned by his neurologist. Otherwise is safe for discharge after observation and no witnessed seizure or seizure-like activity during encounter.  Sent Lamictal  to his pharmacy.  Patient states he had Keppra  at home.  Advised him to follow with neurologist and PCP.  Discussed return precautions.  Discharged in good condition.  Discussed preventative seizure measures at home.  Patient management required discussion with the following services or consulting groups:  Neurology  Complexity of Problems Addressed Acute complicated illness or Injury  Additional Data Reviewed and Analyzed Further history obtained  from: Past medical history and medications listed in the EMR and Prior ED visit notes  Patient Encounter Risk Assessment Prescriptions and Consideration of hospitalization         Final Clinical Impression(s) / ED Diagnoses Final diagnoses:  Seizure [facility])    Rx / DC Orders ED Discharge Orders     None         Janalee Mcmurray, PA-C 08/31/23 1457    Tegeler, Marine Sia, MD 08/31/23 [id number]

## 2023-09-01 LAB — LEVETIRACETAM LEVEL: Levetiracetam Lvl: 50.1 ug/mL — ABNORMAL HIGH (ref 10.0–40.0)

## 2023-09-05 DIAGNOSIS — H0288A Meibomian gland dysfunction right eye, upper and lower eyelids: Secondary | ICD-10-CM | POA: Diagnosis not present

## 2023-09-05 DIAGNOSIS — R569 Unspecified convulsions: Secondary | ICD-10-CM | POA: Diagnosis not present

## 2023-09-05 DIAGNOSIS — H0288B Meibomian gland dysfunction left eye, upper and lower eyelids: Secondary | ICD-10-CM | POA: Diagnosis not present

## 2023-09-05 DIAGNOSIS — Q141 Congenital malformation of retina: Secondary | ICD-10-CM | POA: Diagnosis not present

## 2023-09-21 DIAGNOSIS — S6292XD Unspecified fracture of left wrist and hand, subsequent encounter for fracture with routine healing: Secondary | ICD-10-CM | POA: Diagnosis not present

## 2023-11-03 ENCOUNTER — Encounter: Payer: Self-pay | Admitting: Neurology

## 2023-11-03 ENCOUNTER — Ambulatory Visit (INDEPENDENT_AMBULATORY_CARE_PROVIDER_SITE_OTHER): Admitting: Neurology

## 2023-11-03 VITALS — BP 112/69 | HR 75 | Ht 73.0 in | Wt 210.0 lb

## 2023-11-03 DIAGNOSIS — G40009 Localization-related (focal) (partial) idiopathic epilepsy and epileptic syndromes with seizures of localized onset, not intractable, without status epilepticus: Secondary | ICD-10-CM

## 2023-11-03 MED ORDER — LAMOTRIGINE 25 MG PO TABS: ORAL_TABLET | ORAL | 3 refills | Status: DC

## 2023-11-03 NOTE — Patient Instructions (Signed)
 Good to see you.  Increase Lamotrigine  25mg : take 2 tablets twice a day  2. Levetiracetam  1000mg : take 2 tablets twice a day  3. Follow-up in 3 months, call for any changes   Seizure Precautions: 1. If medication has been prescribed for you to prevent seizures, take it exactly as directed.  Do not stop taking the medicine without talking to your doctor first, even if you have not had a seizure in a long time.   2. Avoid activities in which a seizure would cause danger to yourself or to others.  Don't operate dangerous machinery, swim alone, or climb in high or dangerous places, such as on ladders, roofs, or girders.  Do not drive unless your doctor says you may.  3. If you have any warning that you may have a seizure, lay down in a safe place where you can't hurt yourself.    4.  No driving for 6 months from last seizure, as per [place]  state law.   Please refer to the following link on the Epilepsy Foundation of America's website for more information: http://www.epilepsyfoundation.org/answerplace/Social/driving/drivingu.cfm   5.  Maintain good sleep hygiene. Avoid alcohol.  6.  Contact your doctor if you have any problems that may be related to the medicine you are taking.  7.  Call 911 and bring the patient back to the ED if:        A.  The seizure lasts longer than 5 minutes.       B.  The patient doesn't awaken shortly after the seizure  C.  The patient has new problems such as difficulty seeing, speaking or moving  D.  The patient was injured during the seizure  E.  The patient has a temperature over 102 F (39C)  F.  The patient vomited and now is having trouble breathing
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
## 2023-12-22 DIAGNOSIS — S0219XD Other fracture of base of skull, subsequent encounter for fracture with routine healing: Secondary | ICD-10-CM | POA: Diagnosis not present

## 2023-12-22 DIAGNOSIS — J343 Hypertrophy of nasal turbinates: Secondary | ICD-10-CM | POA: Diagnosis not present

## 2023-12-22 DIAGNOSIS — G40909 Epilepsy, unspecified, not intractable, without status epilepticus: Secondary | ICD-10-CM | POA: Diagnosis not present

## 2023-12-22 DIAGNOSIS — J342 Deviated nasal septum: Secondary | ICD-10-CM | POA: Diagnosis not present

## 2023-12-22 DIAGNOSIS — H6983 Other specified disorders of Eustachian tube, bilateral: Secondary | ICD-10-CM | POA: Diagnosis not present

## 2023-12-22 DIAGNOSIS — S0285XD Fracture of orbit, unspecified, subsequent encounter for fracture with routine healing: Secondary | ICD-10-CM | POA: Diagnosis not present

## 2024-01-05 DIAGNOSIS — H9311 Tinnitus, right ear: Secondary | ICD-10-CM | POA: Diagnosis not present

## 2024-01-05 DIAGNOSIS — Z0110 Encounter for examination of ears and hearing without abnormal findings: Secondary | ICD-10-CM | POA: Diagnosis not present

## 2024-02-06 ENCOUNTER — Ambulatory Visit (INDEPENDENT_AMBULATORY_CARE_PROVIDER_SITE_OTHER): Admitting: Neurology

## 2024-02-06 ENCOUNTER — Encounter: Payer: Self-pay | Admitting: Neurology

## 2024-02-06 VITALS — BP 112/73 | HR 67 | Ht 73.0 in | Wt 208.0 lb

## 2024-02-06 DIAGNOSIS — G40009 Localization-related (focal) (partial) idiopathic epilepsy and epileptic syndromes with seizures of localized onset, not intractable, without status epilepticus: Secondary | ICD-10-CM

## 2024-02-06 DIAGNOSIS — R400 Somnolence: Secondary | ICD-10-CM

## 2024-02-06 MED ORDER — LAMOTRIGINE 25 MG PO TABS: ORAL_TABLET | ORAL | 3 refills | Status: AC

## 2024-02-06 MED ORDER — LEVETIRACETAM 1000 MG PO TABS: ORAL_TABLET | ORAL | 3 refills | Status: AC

## 2024-02-06 NOTE — Patient Instructions (Signed)
 It's always good to see you.  Continue all your medications  2. Schedule home sleep study  3. Follow-up in 4 months, call for any changes  Seizure Precautions: 1. If medication has been prescribed for you to prevent seizures, take it exactly as directed.  Do not stop taking the medicine without talking to your doctor first, even if you have not had a seizure in a long time.   2. Avoid activities in which a seizure would cause danger to yourself or to others.  Don't operate dangerous machinery, swim alone, or climb in high or dangerous places, such as on ladders, roofs, or girders.  Do not drive unless your doctor says you may.  3. If you have any warning that you may have a seizure, lay down in a safe place where you can't hurt yourself.    4.  No driving for 6 months from last seizure, as per [place]  state law.   Please refer to the following link on the Epilepsy Foundation of America's website for more information: http://www.epilepsyfoundation.org/answerplace/Social/driving/drivingu.cfm   5.  Maintain good sleep hygiene. Avoid alcohol.  6.  Contact your doctor if you have any problems that may be related to the medicine you are taking.  7.  Call 911 and bring the patient back to the ED if:        A.  The seizure lasts longer than 5 minutes.       B.  The patient doesn't awaken shortly after the seizure  C.  The patient has new problems such as difficulty seeing, speaking or moving  D.  The patient was injured during the seizure  E.  The patient has a temperature over 102 F (39C)  F.  The patient vomited and now is having trouble breathing
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
## 2024-02-28 ENCOUNTER — Encounter: Payer: Self-pay | Admitting: Neurology

## 2024-06-10 ENCOUNTER — Ambulatory Visit: Admitting: Neurology
